# Patient Record
Sex: Male | Born: 1955 | Race: White | Hispanic: No | Smoking: Current every day smoker
Health system: Southern US, Community
[De-identification: ages and names within clinical notes are randomized; demographics above are authoritative.]

## PROBLEM LIST (undated history)

## (undated) DIAGNOSIS — F419 Anxiety disorder, unspecified: Secondary | ICD-10-CM

## (undated) DIAGNOSIS — Z8739 Personal history of other diseases of the musculoskeletal system and connective tissue: Secondary | ICD-10-CM

## (undated) DIAGNOSIS — M1990 Unspecified osteoarthritis, unspecified site: Secondary | ICD-10-CM

## (undated) DIAGNOSIS — E7800 Pure hypercholesterolemia, unspecified: Secondary | ICD-10-CM

## (undated) DIAGNOSIS — R519 Headache, unspecified: Secondary | ICD-10-CM

## (undated) DIAGNOSIS — I872 Venous insufficiency (chronic) (peripheral): Secondary | ICD-10-CM

## (undated) DIAGNOSIS — I10 Essential (primary) hypertension: Secondary | ICD-10-CM

## (undated) HISTORY — PX: LAPAROSCOPIC GASTRIC BANDING: SHX1100

## (undated) HISTORY — PX: BACK SURGERY: SHX140

---

## 1997-11-27 ENCOUNTER — Encounter: Payer: Self-pay | Admitting: Neurosurgery

## 1997-11-27 ENCOUNTER — Ambulatory Visit (HOSPITAL_COMMUNITY): Admission: RE | Admit: 1997-11-27 | Discharge: 1997-11-27 | Payer: Self-pay | Admitting: Neurosurgery

## 2000-05-16 ENCOUNTER — Other Ambulatory Visit: Admission: RE | Admit: 2000-05-16 | Discharge: 2000-05-16 | Payer: Self-pay | Admitting: Dermatology

## 2001-01-12 ENCOUNTER — Encounter: Admission: RE | Admit: 2001-01-12 | Discharge: 2001-04-12 | Payer: Self-pay | Admitting: Internal Medicine

## 2001-04-24 ENCOUNTER — Encounter: Payer: Self-pay | Admitting: Internal Medicine

## 2001-04-24 ENCOUNTER — Ambulatory Visit (HOSPITAL_COMMUNITY): Admission: RE | Admit: 2001-04-24 | Discharge: 2001-04-24 | Payer: Self-pay | Admitting: Internal Medicine

## 2001-05-27 ENCOUNTER — Emergency Department (HOSPITAL_COMMUNITY): Admission: EM | Admit: 2001-05-27 | Discharge: 2001-05-27 | Payer: Self-pay | Admitting: Emergency Medicine

## 2002-03-01 ENCOUNTER — Encounter: Payer: Self-pay | Admitting: Internal Medicine

## 2002-03-01 ENCOUNTER — Ambulatory Visit (HOSPITAL_COMMUNITY): Admission: RE | Admit: 2002-03-01 | Discharge: 2002-03-01 | Payer: Self-pay | Admitting: Internal Medicine

## 2002-06-06 ENCOUNTER — Encounter (HOSPITAL_COMMUNITY): Admission: RE | Admit: 2002-06-06 | Discharge: 2002-07-06 | Payer: Self-pay | Admitting: Preventative Medicine

## 2002-09-27 ENCOUNTER — Encounter: Payer: Self-pay | Admitting: Orthopedic Surgery

## 2002-09-27 ENCOUNTER — Ambulatory Visit (HOSPITAL_COMMUNITY): Admission: RE | Admit: 2002-09-27 | Discharge: 2002-09-27 | Payer: Self-pay | Admitting: Orthopedic Surgery

## 2003-02-13 ENCOUNTER — Encounter (HOSPITAL_COMMUNITY): Admission: RE | Admit: 2003-02-13 | Discharge: 2003-03-15 | Payer: Self-pay | Admitting: Orthopedic Surgery

## 2004-08-31 ENCOUNTER — Ambulatory Visit (HOSPITAL_COMMUNITY): Admission: RE | Admit: 2004-08-31 | Discharge: 2004-08-31 | Payer: Self-pay | Admitting: Internal Medicine

## 2004-11-09 ENCOUNTER — Ambulatory Visit (HOSPITAL_COMMUNITY): Admission: RE | Admit: 2004-11-09 | Discharge: 2004-11-09 | Payer: Self-pay | Admitting: Orthopaedic Surgery

## 2004-11-25 ENCOUNTER — Ambulatory Visit: Payer: Self-pay | Admitting: Psychology

## 2005-01-18 ENCOUNTER — Ambulatory Visit: Payer: Self-pay | Admitting: Psychology

## 2005-02-18 ENCOUNTER — Encounter: Admission: RE | Admit: 2005-02-18 | Discharge: 2005-02-18 | Payer: Self-pay | Admitting: Neurosurgery

## 2005-03-02 ENCOUNTER — Ambulatory Visit: Payer: Self-pay | Admitting: Psychiatry

## 2005-03-04 ENCOUNTER — Encounter: Admission: RE | Admit: 2005-03-04 | Discharge: 2005-03-04 | Payer: Self-pay | Admitting: Neurosurgery

## 2005-03-09 ENCOUNTER — Ambulatory Visit (HOSPITAL_COMMUNITY): Payer: Self-pay | Admitting: Psychology

## 2005-03-19 ENCOUNTER — Encounter: Admission: RE | Admit: 2005-03-19 | Discharge: 2005-03-19 | Payer: Self-pay | Admitting: Neurosurgery

## 2005-03-23 ENCOUNTER — Ambulatory Visit (HOSPITAL_COMMUNITY): Payer: Self-pay | Admitting: Psychology

## 2005-04-06 ENCOUNTER — Ambulatory Visit (HOSPITAL_COMMUNITY): Payer: Self-pay | Admitting: Psychology

## 2005-04-27 ENCOUNTER — Ambulatory Visit (HOSPITAL_COMMUNITY): Payer: Self-pay | Admitting: Psychiatry

## 2005-04-28 ENCOUNTER — Ambulatory Visit (HOSPITAL_COMMUNITY): Payer: Self-pay | Admitting: Psychology

## 2005-05-12 ENCOUNTER — Ambulatory Visit (HOSPITAL_COMMUNITY): Payer: Self-pay | Admitting: Psychology

## 2005-06-09 ENCOUNTER — Ambulatory Visit (HOSPITAL_COMMUNITY): Payer: Self-pay | Admitting: Psychology

## 2005-06-24 ENCOUNTER — Ambulatory Visit (HOSPITAL_COMMUNITY): Payer: Self-pay | Admitting: Psychiatry

## 2005-07-15 ENCOUNTER — Ambulatory Visit (HOSPITAL_COMMUNITY): Payer: Self-pay | Admitting: Psychology

## 2005-08-24 ENCOUNTER — Ambulatory Visit (HOSPITAL_COMMUNITY): Payer: Self-pay | Admitting: Psychiatry

## 2005-09-02 ENCOUNTER — Ambulatory Visit (HOSPITAL_COMMUNITY): Payer: Self-pay | Admitting: Psychology

## 2005-09-23 ENCOUNTER — Ambulatory Visit (HOSPITAL_COMMUNITY): Payer: Self-pay | Admitting: Psychology

## 2005-10-08 ENCOUNTER — Ambulatory Visit (HOSPITAL_COMMUNITY): Payer: Self-pay | Admitting: Psychology

## 2005-10-21 ENCOUNTER — Ambulatory Visit (HOSPITAL_COMMUNITY): Payer: Self-pay | Admitting: Psychiatry

## 2005-11-18 ENCOUNTER — Ambulatory Visit (HOSPITAL_COMMUNITY): Payer: Self-pay | Admitting: Psychology

## 2005-12-20 ENCOUNTER — Ambulatory Visit (HOSPITAL_COMMUNITY): Payer: Self-pay | Admitting: Psychology

## 2005-12-21 ENCOUNTER — Ambulatory Visit (HOSPITAL_COMMUNITY): Payer: Self-pay | Admitting: Psychiatry

## 2006-01-19 ENCOUNTER — Ambulatory Visit: Payer: Self-pay | Admitting: Internal Medicine

## 2006-01-19 ENCOUNTER — Ambulatory Visit (HOSPITAL_COMMUNITY): Admission: RE | Admit: 2006-01-19 | Discharge: 2006-01-19 | Payer: Self-pay

## 2006-01-21 ENCOUNTER — Ambulatory Visit (HOSPITAL_COMMUNITY): Payer: Self-pay | Admitting: Psychology

## 2006-02-11 ENCOUNTER — Ambulatory Visit (HOSPITAL_COMMUNITY): Payer: Self-pay | Admitting: Psychology

## 2006-02-22 ENCOUNTER — Ambulatory Visit (HOSPITAL_COMMUNITY): Payer: Self-pay | Admitting: Psychiatry

## 2006-03-08 ENCOUNTER — Ambulatory Visit (HOSPITAL_COMMUNITY): Payer: Self-pay | Admitting: Psychology

## 2006-03-15 ENCOUNTER — Ambulatory Visit (HOSPITAL_COMMUNITY): Admission: RE | Admit: 2006-03-15 | Discharge: 2006-03-15 | Payer: Self-pay | Admitting: Internal Medicine

## 2006-04-11 ENCOUNTER — Ambulatory Visit (HOSPITAL_COMMUNITY): Payer: Self-pay | Admitting: Psychology

## 2006-04-15 ENCOUNTER — Encounter: Admission: RE | Admit: 2006-04-15 | Discharge: 2006-04-15 | Payer: Self-pay | Admitting: Neurosurgery

## 2006-04-19 ENCOUNTER — Ambulatory Visit (HOSPITAL_COMMUNITY): Payer: Self-pay | Admitting: Psychiatry

## 2006-05-02 ENCOUNTER — Emergency Department (HOSPITAL_COMMUNITY): Admission: EM | Admit: 2006-05-02 | Discharge: 2006-05-02 | Payer: Self-pay | Admitting: Emergency Medicine

## 2006-05-02 ENCOUNTER — Encounter: Admission: RE | Admit: 2006-05-02 | Discharge: 2006-05-02 | Payer: Self-pay | Admitting: Neurosurgery

## 2006-05-11 ENCOUNTER — Ambulatory Visit (HOSPITAL_COMMUNITY): Payer: Self-pay | Admitting: Psychology

## 2006-05-19 ENCOUNTER — Encounter: Admission: RE | Admit: 2006-05-19 | Discharge: 2006-05-19 | Payer: Self-pay | Admitting: Neurosurgery

## 2006-06-09 ENCOUNTER — Ambulatory Visit (HOSPITAL_COMMUNITY): Payer: Self-pay | Admitting: Psychology

## 2006-07-11 ENCOUNTER — Ambulatory Visit (HOSPITAL_COMMUNITY): Payer: Self-pay | Admitting: Psychology

## 2006-07-14 ENCOUNTER — Ambulatory Visit (HOSPITAL_COMMUNITY): Payer: Self-pay | Admitting: Psychiatry

## 2006-07-20 ENCOUNTER — Ambulatory Visit (HOSPITAL_COMMUNITY): Admission: RE | Admit: 2006-07-20 | Discharge: 2006-07-20 | Payer: Self-pay | Admitting: Orthopaedic Surgery

## 2006-08-16 ENCOUNTER — Ambulatory Visit (HOSPITAL_COMMUNITY): Payer: Self-pay | Admitting: Psychology

## 2006-09-27 ENCOUNTER — Ambulatory Visit (HOSPITAL_COMMUNITY): Payer: Self-pay | Admitting: Psychology

## 2006-10-13 ENCOUNTER — Ambulatory Visit (HOSPITAL_COMMUNITY): Payer: Self-pay | Admitting: Psychiatry

## 2006-10-27 ENCOUNTER — Ambulatory Visit (HOSPITAL_COMMUNITY): Payer: Self-pay | Admitting: Psychology

## 2006-12-08 ENCOUNTER — Ambulatory Visit (HOSPITAL_COMMUNITY): Payer: Self-pay | Admitting: Psychology

## 2006-12-20 ENCOUNTER — Ambulatory Visit (HOSPITAL_COMMUNITY): Admission: RE | Admit: 2006-12-20 | Discharge: 2006-12-20 | Payer: Self-pay | Admitting: Internal Medicine

## 2007-01-06 ENCOUNTER — Ambulatory Visit (HOSPITAL_COMMUNITY): Payer: Self-pay | Admitting: Psychology

## 2007-01-12 ENCOUNTER — Ambulatory Visit (HOSPITAL_COMMUNITY): Payer: Self-pay | Admitting: Psychiatry

## 2007-01-31 ENCOUNTER — Ambulatory Visit (HOSPITAL_COMMUNITY): Admission: RE | Admit: 2007-01-31 | Discharge: 2007-02-01 | Payer: Self-pay | Admitting: Neurosurgery

## 2007-02-15 ENCOUNTER — Ambulatory Visit (HOSPITAL_COMMUNITY): Payer: Self-pay | Admitting: Psychology

## 2007-03-15 ENCOUNTER — Ambulatory Visit (HOSPITAL_COMMUNITY): Payer: Self-pay | Admitting: Psychology

## 2007-04-11 ENCOUNTER — Ambulatory Visit (HOSPITAL_COMMUNITY): Payer: Self-pay | Admitting: Psychiatry

## 2007-04-13 ENCOUNTER — Ambulatory Visit (HOSPITAL_COMMUNITY): Payer: Self-pay | Admitting: Psychology

## 2007-05-12 ENCOUNTER — Ambulatory Visit (HOSPITAL_COMMUNITY): Payer: Self-pay | Admitting: Psychology

## 2007-07-11 ENCOUNTER — Ambulatory Visit (HOSPITAL_COMMUNITY): Payer: Self-pay | Admitting: Psychiatry

## 2007-07-25 ENCOUNTER — Ambulatory Visit (HOSPITAL_COMMUNITY): Payer: Self-pay | Admitting: Psychology

## 2007-10-03 ENCOUNTER — Ambulatory Visit (HOSPITAL_COMMUNITY): Payer: Self-pay | Admitting: Psychiatry

## 2007-12-12 ENCOUNTER — Ambulatory Visit (HOSPITAL_COMMUNITY): Payer: Self-pay | Admitting: Psychiatry

## 2008-01-12 ENCOUNTER — Ambulatory Visit (HOSPITAL_COMMUNITY): Payer: Self-pay | Admitting: Psychology

## 2008-02-12 ENCOUNTER — Ambulatory Visit (HOSPITAL_COMMUNITY): Payer: Self-pay | Admitting: Psychology

## 2008-03-14 ENCOUNTER — Ambulatory Visit (HOSPITAL_COMMUNITY): Payer: Self-pay | Admitting: Psychiatry

## 2008-04-12 ENCOUNTER — Ambulatory Visit (HOSPITAL_COMMUNITY): Payer: Self-pay | Admitting: Psychology

## 2008-05-09 ENCOUNTER — Ambulatory Visit (HOSPITAL_COMMUNITY): Payer: Self-pay | Admitting: Psychiatry

## 2008-08-08 ENCOUNTER — Ambulatory Visit (HOSPITAL_COMMUNITY): Payer: Self-pay | Admitting: Psychiatry

## 2008-12-17 ENCOUNTER — Ambulatory Visit (HOSPITAL_COMMUNITY): Payer: Self-pay | Admitting: Psychiatry

## 2009-01-25 IMAGING — CR DG FOOT COMPLETE 3+V*L*
3 series · 3 of 3 positions shown · non-contrast
Comparison: None.

CLINICAL DATA: 50 year-old-male with pain in both feet.  Pain from superior portion of foot down to the lateral foot.  The pain started [REDACTED].  
 LEFT FOOT ? 3 VIEW:

[view not recorded (1 of 3)]
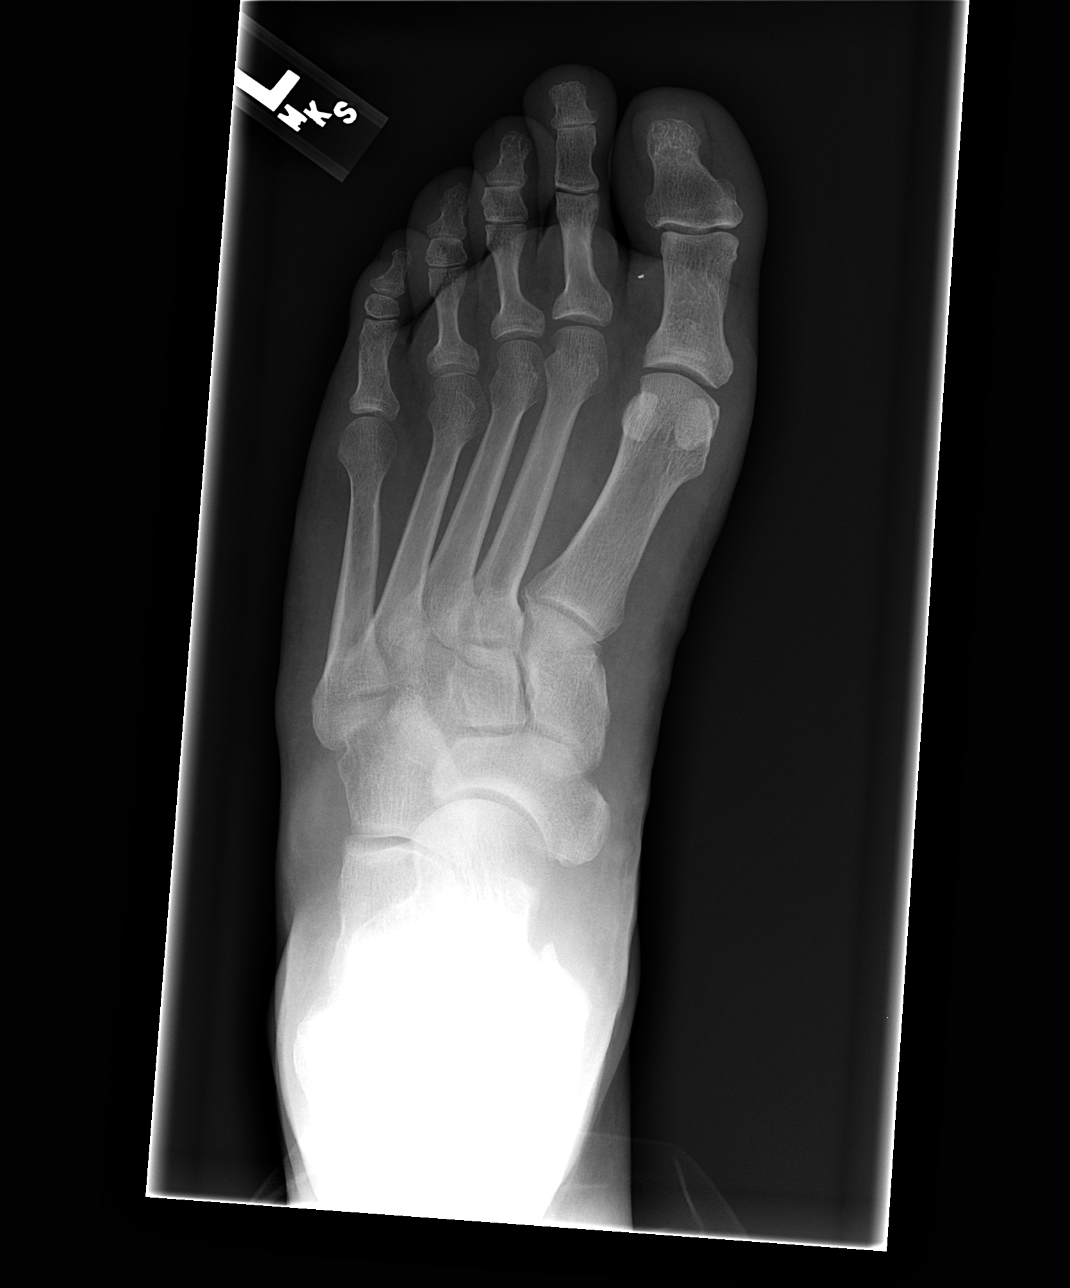

[view not recorded (2 of 3)]
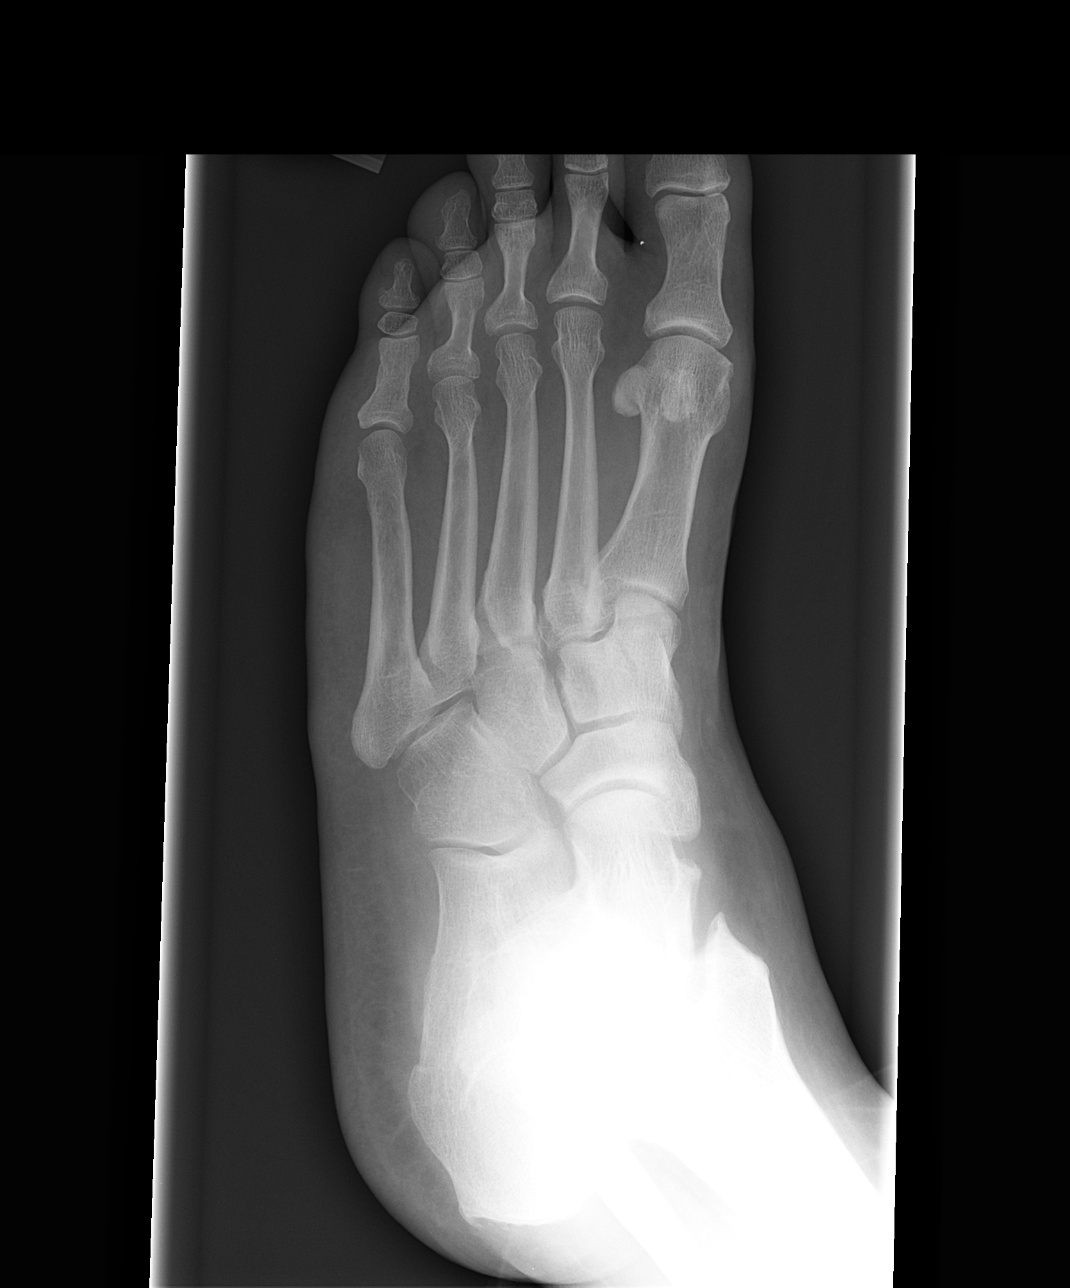

[view not recorded (3 of 3)]
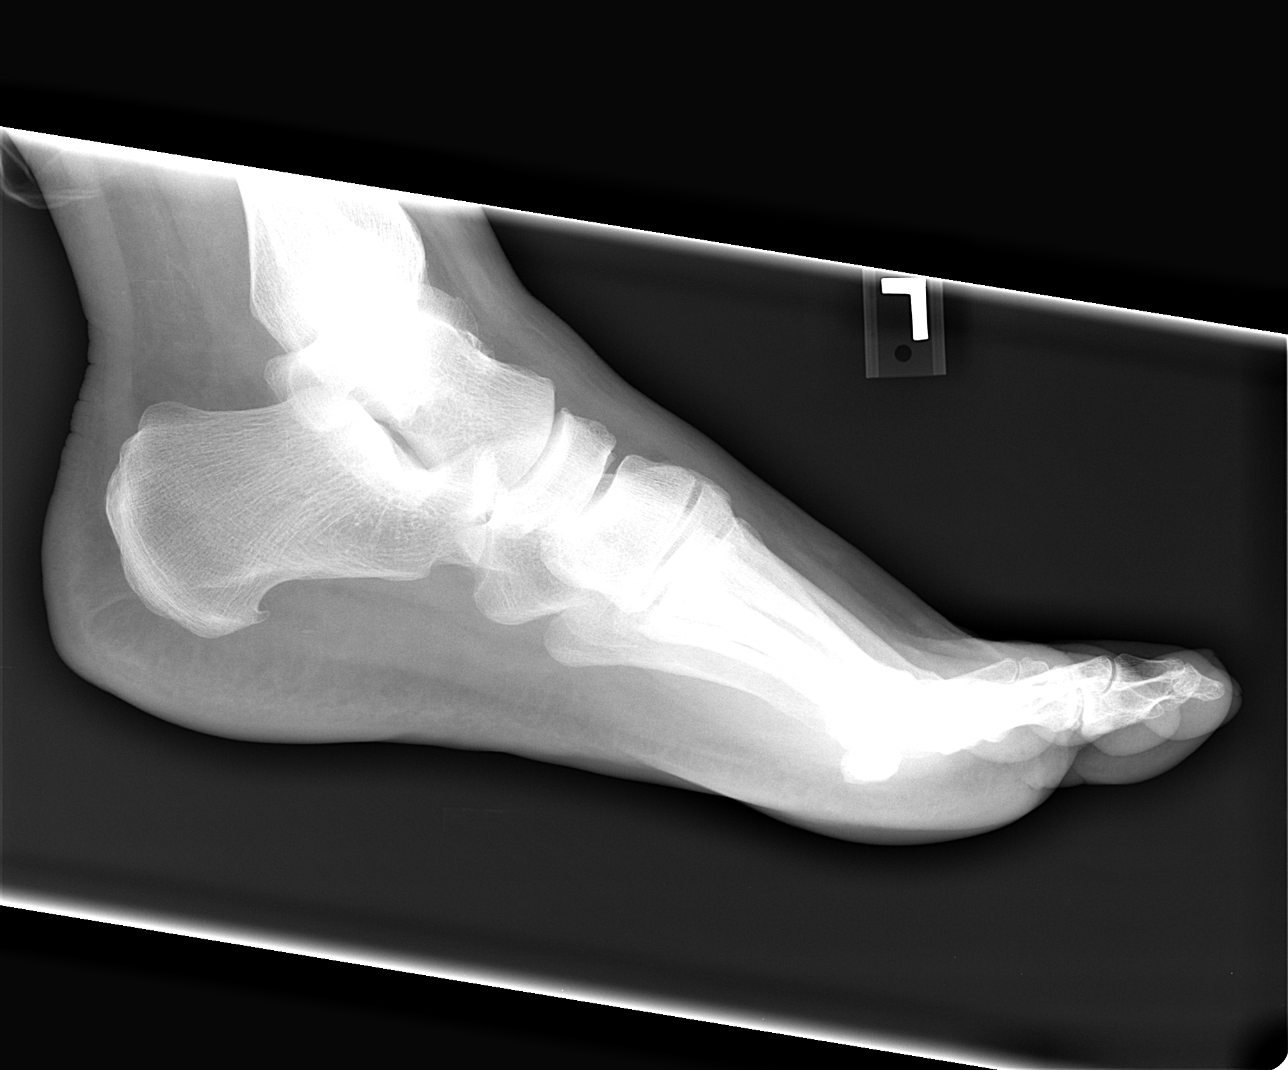

[3 of 3 positions shown; findings below may reference images not displayed]

FINDINGS: Small plantar calcaneal spur is identified.  No evidence for acute fracture or dislocation.  A small metallic foreign body is suspected just lateral to the proximal phalanx of this great toe.  This measures 3 mm in diameter.
IMPRESSION: 1.  No evidence for acute abnormality. 
 2.  Suspect small metallic foreign body within the lateral base of the great toe. 
 RIGHT FOOT ? 3 VIEW:
FINDINGS: There may be mild soft tissue swelling along the lateral aspect of the right foot.  No evidence for acute fracture, dislocation or foreign body, however.
IMPRESSION: 1.  Suspect lateral soft tissue swelling.  
 2.  No evidence for acute fracture.

## 2009-01-25 IMAGING — CR DG FOOT COMPLETE 3+V*R*
3 series · 3 of 3 positions shown · non-contrast
Comparison: None.

CLINICAL DATA: 50 year-old-male with pain in both feet.  Pain from superior portion of foot down to the lateral foot.  The pain started [REDACTED].  
 LEFT FOOT ? 3 VIEW:

[view not recorded (1 of 3)]
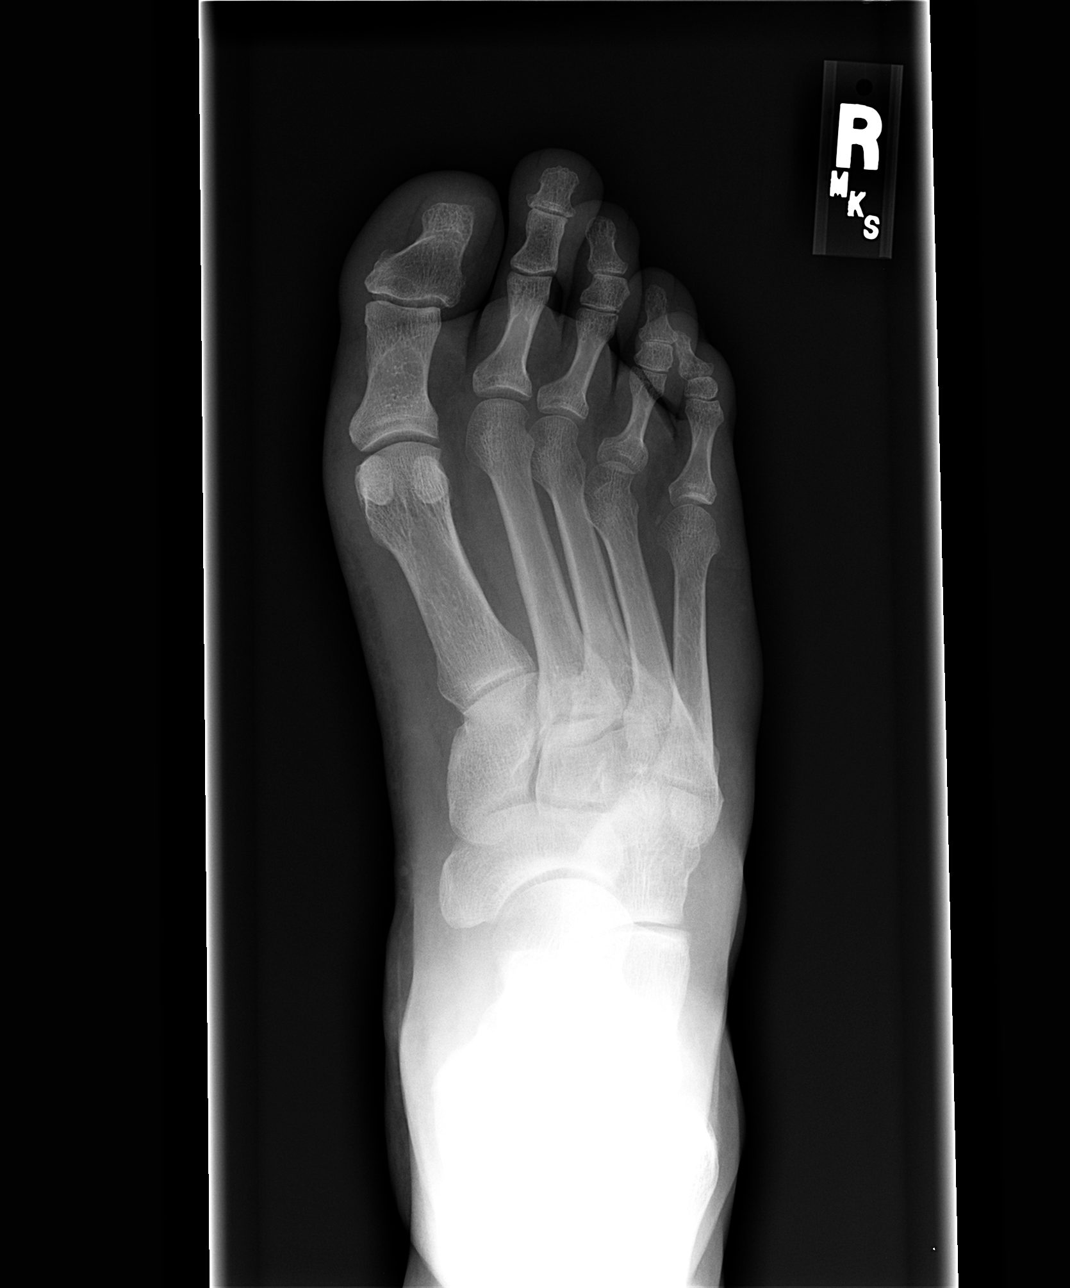

[view not recorded (2 of 3)]
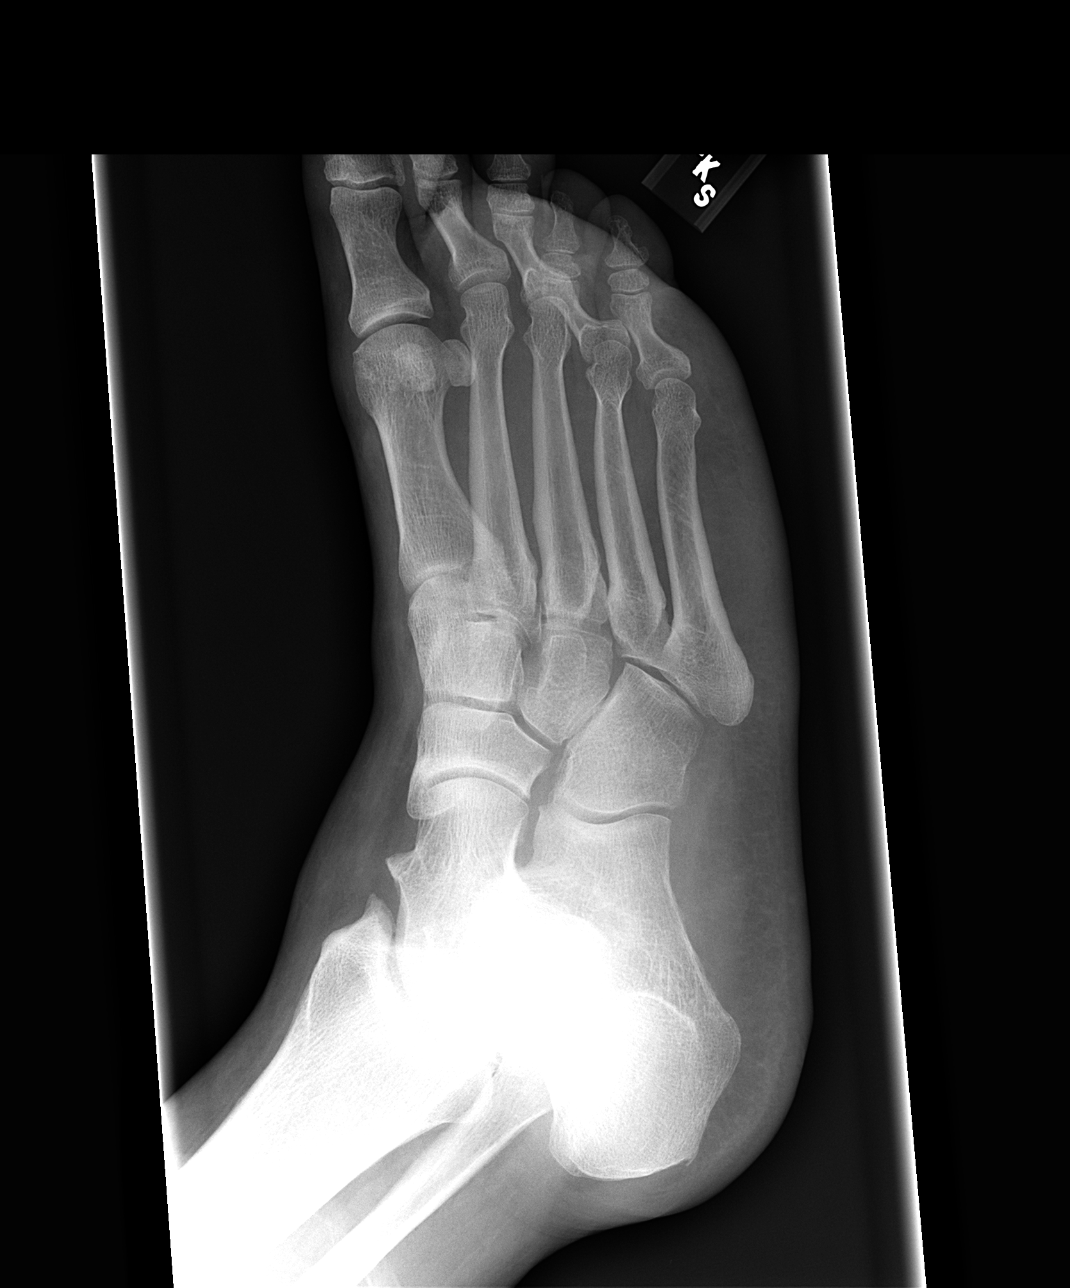

[view not recorded (3 of 3)]
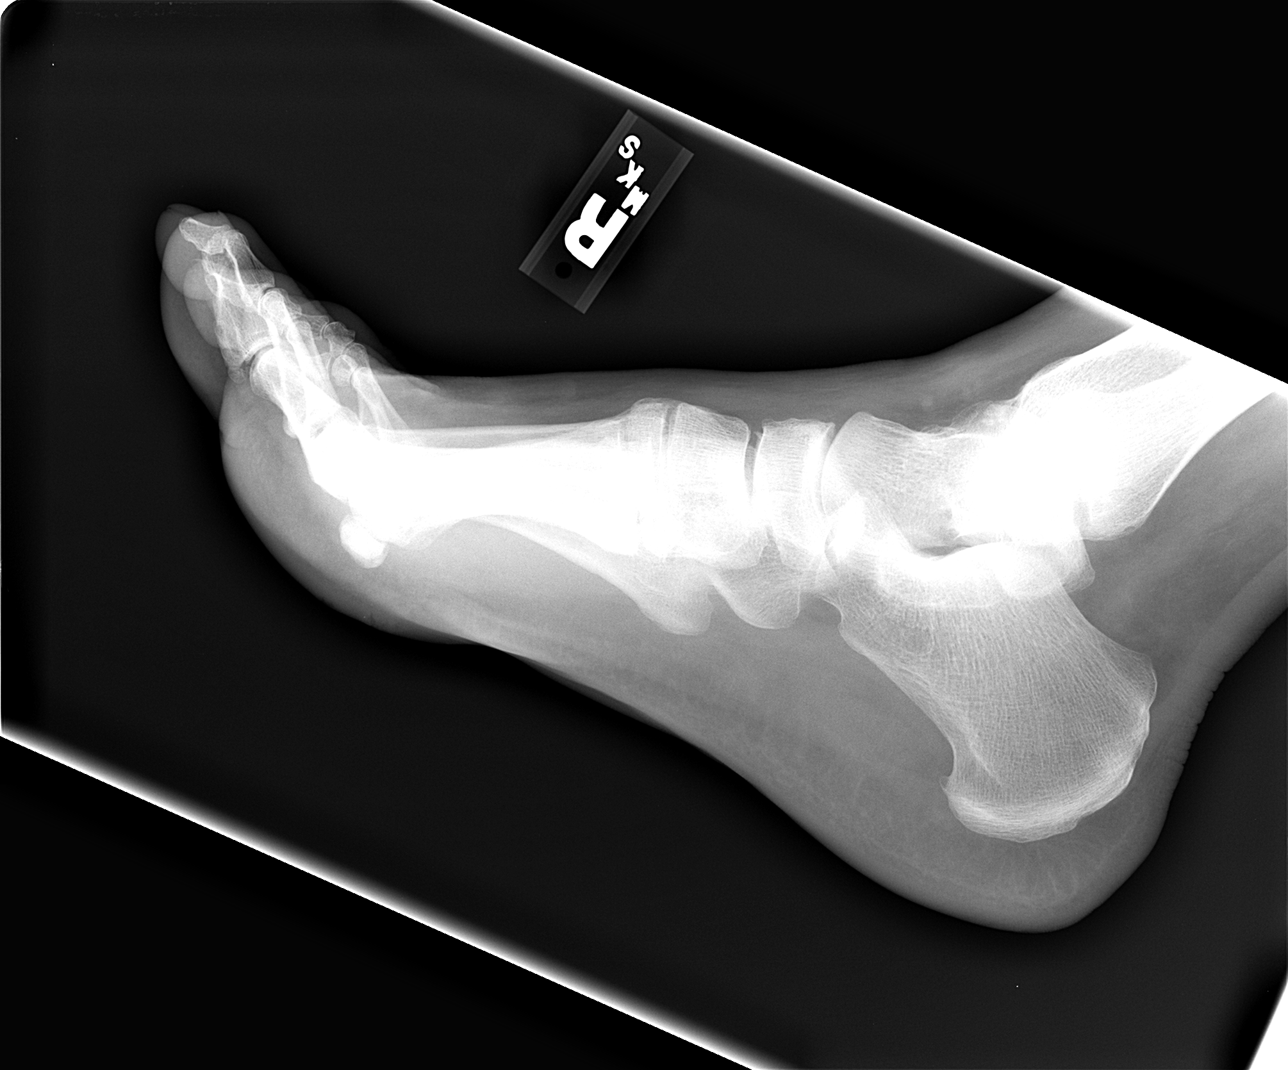

[3 of 3 positions shown; findings below may reference images not displayed]

FINDINGS: Small plantar calcaneal spur is identified.  No evidence for acute fracture or dislocation.  A small metallic foreign body is suspected just lateral to the proximal phalanx of this great toe.  This measures 3 mm in diameter.
IMPRESSION: 1.  No evidence for acute abnormality. 
 2.  Suspect small metallic foreign body within the lateral base of the great toe. 
 RIGHT FOOT ? 3 VIEW:
FINDINGS: There may be mild soft tissue swelling along the lateral aspect of the right foot.  No evidence for acute fracture, dislocation or foreign body, however.
IMPRESSION: 1.  Suspect lateral soft tissue swelling.  
 2.  No evidence for acute fracture.

## 2010-05-19 NOTE — Group Therapy Note (Signed)
NAMESTEPHENS, SHREVE NO.:  [id number]   MEDICAL RECORD NO.:  [id number]           PATIENT TYPE:   LOCATION:                                 FACILITY:   PHYSICIAN:  Syed T. Arfeen, M.D.        DATE OF BIRTH:                                 PROGRESS NOTE   Patient came in today for his followup appointment.  He appears to be  very happy and pleasant.  He had a lap band surgery on April 13, 2008,  which went very well.  He has now lost almost 50 pounds and feels better  with weight loss.  He feels more energetic and started to spend some  time in yard.  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  Patient had a lap band surgery at [facility] in [place].  Patient is very happy with his procedure.  He has  been compliant with his Paxil and he still requires sometimes  hydroxyzine for sleep.  He reported no side effects of the medication.  He endorsed increased energy level, and denies any negative thoughts.   ASSESSMENT:  Major depressive disorder.  Patient is pleasant, cooperative.  He denies  any auditory hallucinations, suicidal thoughts, or homicidal thoughts.  His affect is brighter.  He feels better since he lost 50 pounds due to  bariatric surgery.   PLAN:  We will continue his medication which is Paxil CR 25 mg one daily and  hydroxyzine 25 mg one to two at bedtime as needed.  We talked about  risks and benefits of the medication.  I will see him in 3 months.      Syed T. Lolly Mustache, M.D.  Electronically Signed     STA/MEDQ  D:  08/08/2008  T:  08/08/2008  Job:  [id number]

## 2010-05-19 NOTE — H&P (Signed)
NAMEMD, SMOLA NO.:  [id number]   MEDICAL RECORD NO.:  [id number]          PATIENT TYPE:  OIB   LOCATION:  5031                         FACILITY:  [facility]   PHYSICIAN:  Payton Doughty, M.D.      DATE OF BIRTH:  11-Aug-1955   DATE OF ADMISSION:  01/31/2007  DATE OF DISCHARGE:                              HISTORY & PHYSICAL   ADMISSION DIAGNOSIS:  Herniated disk at L5-S1 on the right.   This is a very nice 55 year old right-handed white gentleman who I have  been following for 10 years.  He injured his back in 90 and has been  struggling with it ever since.  He ended up with an MR that shows a disk  at 5-1 off to the right.  He is now admitted for lumbar diskectomy.   MEDICAL HISTORY:  Is remarkable for the venous stasis disease of the  lower extremities.  He uses compression stockings for those.  He also  has hypertension.   ALLERGIES:  None.   SOCIAL HISTORY:  He smokes a pack cigarettes a day, drinks alcohol on  social basis.  He is on disability.   FAMILY HISTORY:  Mother 41 in good health.  Father died of a malignant  chordoma at about 55 years of age.   REVIEW OF SYSTEMS:  Marked for back and leg pain.  HEENT:  Exam within  normal limits.  Has good range of motion in his neck.  CHEST: Has  diffuse crackles.  CARDIAC:  Regular rate and rhythm.  ABDOMEN:  Large  but nontender with no hepatosplenomegaly.  EXTREMITIES:  No clubbing,  cyanosis.  GU: Exam is deferred.  Peripheral pulses are good.  NEUROLOGICALLY:  He is awake, alert and oriented, cranial nerves intact.  Motor exam shows 5/5 strength throughout the upper and lower extremities  except for mild plantar flexion weakness on the right secondary to pain.  Sensation diminished in right S1 distribution.  Reflexes are 1 at the  knees, absent right ankle, 1 at the left.  Straight leg is positive on  the right.   MR shows disk at 5-1 worse off to the right.   CLINICAL IMPRESSION:  Right S1  radiculopathy related herniated disk.   PLAN:  Right L5-S1 laminectomy diskectomy.  The risks and benefits have  been discussed and he wishes to proceed.    .           ______________________________  Payton Doughty, M.D.    MWR/MEDQ  D:  01/31/2007  T:  02/01/2007  Job:  [id number]

## 2010-05-19 NOTE — Op Note (Signed)
NAMEDEZMEN, ALCOCK NO.:  [id number]   MEDICAL RECORD NO.:  [id number]          PATIENT TYPE:  OIB   LOCATION:  5031                         FACILITY:  [facility]   PHYSICIAN:  Payton Doughty, M.D.      DATE OF BIRTH:  1955/06/29   DATE OF PROCEDURE:  01/31/2007  DATE OF DISCHARGE:  02/01/2007                               OPERATIVE REPORT   PREOPERATIVE DIAGNOSIS:  Herniated disk on the right side at L5-S1.   POSTOPERATIVE DIAGNOSIS:  Herniated disk on the right side at L5-S1.   PROCEDURES:  Right L5-S1 laminectomy diskectomy.   ANESTHESIA:  General endotracheal.   PREP:  Sterile Betadine prep and scrub with alcohol wipe.   COMPLICATIONS:  None.   NURSE ASSISTANT:  [place].   DOCTOR ASSISTANT:  Hewitt Shorts, M.D.   BODY OF TEXT:  55 year old gentleman herniated disk at 5-1 on the right  and a right S1 radiculopathy, taken to operating room, smoothly  anesthetized, intubated, placed prone on the operating table.  Following  shave, prep, draped in usual sterile fashion, skin was infiltrated with  1% lidocaine with 1:400,000 epinephrine.  Skin was incised over the L5-  S1 interspace.  Dissecting to the subcutaneous fat which was fairly  abundant and muscle, the lamina of L5 was identified and confirmed with  intraoperative x-ray.  Hemisemilaminectomy of L5 was carried out with  high-speed drill to the top of ligamentum flavum.  It was removed in  retrograde fashion with the Kerrison punch.  The thecal sac and the  right S1 nerve root were identified and nerve root was mobilized. Right  S1 nerve root was mobilized medially retracted with D'Errico retractor.  Annular fibers were divided and the disk removed.  Disk removed without  difficulty.  Nerve root carefully explored and all quadrants found to be  open.  Wound was irrigated.  Hemostasis assured and Depo-Medrol soaked  fat was packed in laminotomy defect.  Successive layers of 0-0 Vicryl, 2-  0  Vicryl, 3-0 nylon were used to close.  Betadine Telfa dressing was  applied, made occlusive with OpSite and the patient returned recovery  room in good condition.           ______________________________  Payton Doughty, M.D.     MWR/MEDQ  D:  01/31/2007  T:  02/01/2007  Job:  [id number]

## 2010-05-22 NOTE — Op Note (Signed)
[name], [name]                ACCOUNT NO.:  [id number]   MEDICAL RECORD NO.:  [id number]          PATIENT TYPE:  AMB   LOCATION:  DAY                           FACILITY:  [facility]   PHYSICIAN:  R. Roetta Sessions, M.D. DATE OF BIRTH:  11/21/1955   DATE OF PROCEDURE:  01/19/2006  DATE OF DISCHARGE:                               OPERATIVE REPORT   PROCEDURE:  Screening colonoscopy.   INDICATIONS FOR PROCEDURE:  The patient is a 55 year old gentleman  devoid of any GI tract symptoms sent over courtesy of Dr. Carylon Perches for  colorectal cancer screening.  He has never had his lower GI tract  imaged.  There is no family history of colorectal neoplasia.  He is here  for screening.  This approach has discussed with the patient at length.  Potential risks, benefits and alternatives have been reviewed, questions  answered, and he is agreeable.  Please see documentation in the medical  record.   PROCEDURE NOTE:  O2 saturation, blood pressure, pulse and respirations  were monitored throughout the entire procedure.   CONSCIOUS SEDATION:  Versed 5 mg IV, Demerol 100 mg IV in divided doses.   INSTRUMENT:  Pentax video chip system.   FINDINGS:  Digital rectal exam revealed no abnormalities.   ENDOSCOPIC FINDINGS:  The prep was excellent.  Examination of colonic  mucosa from rectosigmoid junction through the left, transverse and right  colon, to area of appendiceal orifice, ileocecal valve and cecum were  undertaken.  These structures well seen and photographed for the record.  From this level scope was withdrawn.  All previous mucosal surface were  again seen.  The colonic mucosa appeared normal.  The scope was pulled  down to the rectum.  Thorough examination of rectal mucosa including  retroflex view of the anal verge was undertaken.  The rectal mucosa  appeared normal.  The patient tolerated the procedure well was reacted  in endoscopy.   IMPRESSION:  1. Normal rectum.  2. Normal  colon.   RECOMMENDATIONS:  Repeat screening colonoscopy 10 years.      Erik Wright, M.D.  Electronically Signed     RMR/MEDQ  D:  01/19/2006  T:  01/19/2006  Job:  [id number]   cc:   Kingsley Callander. Ouida Sills, MD  Fax: [phone]

## 2010-09-25 LAB — DIFFERENTIAL
Basophils Absolute: 0
Basophils Relative: 0
Eosinophils Relative: 3
Neutro Abs: 4.9

## 2010-09-25 LAB — COMPREHENSIVE METABOLIC PANEL
ALT: 32
AST: 26
Albumin: 4.1
BUN: 14
Calcium: 9.5
Chloride: 102
GFR calc non Af Amer: >60
Glucose, Bld: 88
Potassium: 4.5
Total Protein: 7.1

## 2010-09-25 LAB — URINALYSIS, ROUTINE W REFLEX MICROSCOPIC
Glucose, UA: NEGATIVE
Specific Gravity, Urine: 1.024
Urobilinogen, UA: 1
pH: 7

## 2010-09-25 LAB — CBC
MCV: 91.1
RDW: 14.1
WBC: 7.4

## 2010-09-25 LAB — APTT: aPTT: 27

## 2011-07-16 ENCOUNTER — Other Ambulatory Visit (HOSPITAL_COMMUNITY): Payer: Self-pay | Admitting: Psychiatry

## 2011-08-12 ENCOUNTER — Other Ambulatory Visit (HOSPITAL_COMMUNITY): Payer: Self-pay | Admitting: Internal Medicine

## 2011-08-12 ENCOUNTER — Ambulatory Visit (HOSPITAL_COMMUNITY)
Admission: RE | Admit: 2011-08-12 | Discharge: 2011-08-12 | Disposition: A | Discharge status: Home / Self Care | Payer: Federal, State, Local not specified - PPO | Source: Ambulatory Visit | Attending: Internal Medicine | Admitting: Internal Medicine

## 2011-08-12 DIAGNOSIS — M25569 Pain in unspecified knee: Secondary | ICD-10-CM | POA: Insufficient documentation

## 2011-08-12 DIAGNOSIS — R52 Pain, unspecified: Secondary | ICD-10-CM

## 2012-06-23 ENCOUNTER — Ambulatory Visit (HOSPITAL_COMMUNITY)
Admission: RE | Admit: 2012-06-23 | Discharge: 2012-06-23 | Disposition: A | Discharge status: Home / Self Care | Payer: Federal, State, Local not specified - PPO | Source: Ambulatory Visit | Attending: Internal Medicine | Admitting: Internal Medicine

## 2012-06-23 ENCOUNTER — Emergency Department (HOSPITAL_COMMUNITY)
Admission: EM | Admit: 2012-06-23 | Discharge: 2012-06-23 | Disposition: A | Discharge status: Home / Self Care | Payer: Federal, State, Local not specified - PPO | Attending: Emergency Medicine | Admitting: Emergency Medicine

## 2012-06-23 ENCOUNTER — Other Ambulatory Visit (HOSPITAL_COMMUNITY): Payer: Self-pay | Admitting: Internal Medicine

## 2012-06-23 ENCOUNTER — Encounter (HOSPITAL_COMMUNITY): Payer: Self-pay | Admitting: *Deleted

## 2012-06-23 DIAGNOSIS — M79672 Pain in left foot: Secondary | ICD-10-CM

## 2012-06-23 DIAGNOSIS — F17200 Nicotine dependence, unspecified, uncomplicated: Secondary | ICD-10-CM | POA: Insufficient documentation

## 2012-06-23 DIAGNOSIS — W208XXA Other cause of strike by thrown, projected or falling object, initial encounter: Secondary | ICD-10-CM | POA: Insufficient documentation

## 2012-06-23 DIAGNOSIS — S92912B Unspecified fracture of left toe(s), initial encounter for open fracture: Secondary | ICD-10-CM

## 2012-06-23 DIAGNOSIS — S90212A Contusion of left great toe with damage to nail, initial encounter: Secondary | ICD-10-CM

## 2012-06-23 DIAGNOSIS — Y9389 Activity, other specified: Secondary | ICD-10-CM | POA: Insufficient documentation

## 2012-06-23 DIAGNOSIS — S90129A Contusion of unspecified lesser toe(s) without damage to nail, initial encounter: Secondary | ICD-10-CM | POA: Insufficient documentation

## 2012-06-23 DIAGNOSIS — S97102A Crushing injury of unspecified left toe(s), initial encounter: Secondary | ICD-10-CM

## 2012-06-23 DIAGNOSIS — Z79899 Other long term (current) drug therapy: Secondary | ICD-10-CM | POA: Insufficient documentation

## 2012-06-23 DIAGNOSIS — Y9289 Other specified places as the place of occurrence of the external cause: Secondary | ICD-10-CM | POA: Insufficient documentation

## 2012-06-23 DIAGNOSIS — S97109A Crushing injury of unspecified toe(s), initial encounter: Secondary | ICD-10-CM | POA: Insufficient documentation

## 2012-06-23 DIAGNOSIS — S92919A Unspecified fracture of unspecified toe(s), initial encounter for closed fracture: Secondary | ICD-10-CM | POA: Insufficient documentation

## 2012-06-23 DIAGNOSIS — R110 Nausea: Secondary | ICD-10-CM | POA: Insufficient documentation

## 2012-06-23 MED ORDER — CEFAZOLIN SODIUM 1 G IJ SOLR
1.0000 g | Freq: Once | INTRAMUSCULAR | Status: AC
  Administered 2012-06-23: 1 g via INTRAMUSCULAR
  Filled 2012-06-23: qty 10

## 2012-06-23 MED ORDER — BUPIVACAINE HCL (PF) 0.5 % IJ SOLN
INTRAMUSCULAR | Status: AC
  Filled 2012-06-23: qty 30

## 2012-06-23 MED ORDER — BUPIVACAINE HCL (PF) 0.5 % IJ SOLN
10.0000 mL | Freq: Once | INTRAMUSCULAR | Status: AC
  Administered 2012-06-23: 10 mL

## 2012-06-23 MED ORDER — OXYCODONE-ACETAMINOPHEN 5-325 MG PO TABS
1.0000 | ORAL_TABLET | Freq: Once | ORAL | Status: AC
  Administered 2012-06-23: 1 via ORAL
  Filled 2012-06-23: qty 1

## 2012-06-23 MED ORDER — ONDANSETRON 8 MG PO TBDP
8.0000 mg | ORAL_TABLET | Freq: Once | ORAL | Status: AC
  Administered 2012-06-23: 8 mg via ORAL
  Filled 2012-06-23: qty 1

## 2012-06-23 MED ORDER — BACITRACIN ZINC 500 UNIT/GM EX OINT
TOPICAL_OINTMENT | CUTANEOUS | Status: AC
  Administered 2012-06-23: 1
  Filled 2012-06-23: qty 0.9

## 2012-06-23 MED ORDER — CEPHALEXIN 500 MG PO CAPS: 500.0000 mg | ORAL_CAPSULE | Freq: Three times a day (TID) | ORAL | Status: DC

## 2012-06-23 MED ORDER — LIDOCAINE HCL (PF) 2 % IJ SOLN: 10.0000 mL | Freq: Once | INTRAMUSCULAR | Status: DC

## 2012-06-23 MED ORDER — OXYCODONE-ACETAMINOPHEN 5-325 MG PO TABS: ORAL_TABLET | ORAL | Status: DC

## 2012-06-23 NOTE — ED Notes (Signed)
Pt states left great toe injury. Bleeding to the toe. Xray shoes comminuted fracture deformity involving the distal phalanx. Pt states a tractor weight fell on the toe just PTA. [facility]agan's office sent pt over for an xray and for orthopedic to see him in ED. Unsure if orthopedist has been contacted.

## 2012-06-23 NOTE — ED Notes (Signed)
Pt states he has crutches at home.

## 2012-06-23 NOTE — ED Notes (Signed)
Left in c/o spouse for transport home; instructions, prescriptions and f/u information given/reviewed - verbalizes understanding.

## 2012-06-23 NOTE — ED Notes (Signed)
Contacted Dr. Romeo Apple. He stated Dr. Alonza Smoker office notified him and he told the office to have the patient seen in the ED first.

## 2012-06-23 NOTE — ED Provider Notes (Signed)
History    This chart was scribed for Ward Givens, MD by Leone Payor, ED Scribe. This patient was seen in room [facility] and the patient's care was started 2:03 PM.  CSN: [id number]  Arrival date & time 06/23/12  1336   First MD Initiated Contact with Patient 06/23/12 1358      Chief Complaint  Patient presents with  . Toe Injury     The history is provided by the patient. No language interpreter was used.    HPI Comments: Erik Wright is a 57 y.o. male who presents to the Emergency Department complaining of constant, unchanged L great toe injury that occurred about 2 hours ago. He reports a tractor weight (75-100 lb) fell off of something and onto his toe. He was with a few other people who were able to remove the weight soon after it fell. He now has pain, swelling, and bleeding from a laceration to the bottom of great toe. He denies any other injury. He c/o a lot of pain and is getting nauseated without vomiting. Per pt, he called Dr. Alonza Smoker office who sent him for outpatient xrays and then was instructed to come to the ED to see Dr Romeo Apple, orthopedics.  Last PO was this morning around 9am. He denies DM.   Last tetanus is unknown but doesn't believe it is greater than 10 years.   Pt is a 1.2 pack smoker.   PCP Dr. Ouida Sills.  History reviewed. No pertinent past medical history.  Past Surgical History  Procedure Laterality Date  . Back surgery      History reviewed. No pertinent family history.  History  Substance Use Topics  . Smoking status: Current Every Day Smoker  . Smokeless tobacco: Not on file  . Alcohol Use: Yes     Comment: Occ  lives at home    Review of Systems  Musculoskeletal: Positive for joint swelling and arthralgias (L great toe pain).  Skin: Positive for wound (L great toe bleeding).  All other systems reviewed and are negative.    Allergies  Review of patient's allergies indicates no known allergies.  Home Medications   Current  Outpatient Rx  Name  Route  Sig  Dispense  Refill  . chlorthalidone (HYGROTON) 25 MG tablet   Oral   Take 12.5 mg by mouth daily.         . diazepam (VALIUM) 5 MG tablet   Oral   Take 5 mg by mouth 2 (two) times daily as needed for anxiety.         Marland Kitchen PARoxetine (PAXIL-CR) 25 MG 24 hr tablet   Oral   Take 25 mg by mouth daily.         . simvastatin (ZOCOR) 20 MG tablet   Oral   Take 20 mg by mouth daily.         Percocet for chronic back pain prescribed by Dr Channing Mutters  BP 131/94  Pulse 88  Temp(Src) 97.8 F (36.6 C) (Oral)  Resp 20  Ht 5\' 10"  (1.778 m)  Wt 260 lb (117.935 kg)  BMI 37.31 kg/m2  SpO2 98%  Vital signs normal    Physical Exam  Nursing note and vitals reviewed. Constitutional: He is oriented to person, place, and time. He appears well-developed and well-nourished.  Non-toxic appearance. He does not appear ill. No distress.  HENT:  Head: Normocephalic and atraumatic.  Right Ear: External ear normal.  Left Ear: External ear normal.  Nose: Nose normal.  No mucosal edema or rhinorrhea.  Mouth/Throat: Mucous membranes are normal. No dental abscesses or edematous.  Eyes: Conjunctivae and EOM are normal. Pupils are equal, round, and reactive to light.  Neck: Normal range of motion and full passive range of motion without pain. Neck supple.  Pulmonary/Chest: Effort normal. No respiratory distress. He has no rhonchi. He exhibits no crepitus.  Abdominal: Normal appearance.  Musculoskeletal: Normal range of motion. He exhibits tenderness.  Moves all extremities well. Diffuse mild swelling of L great toe. Pt has dried blood covering his toe. He has mild swelling and bruising of the second toe.   Neurological: He is alert and oriented to person, place, and time. He has normal strength. No cranial nerve deficit.  Skin: Skin is warm, dry and intact. No rash noted. No erythema. No pallor.  Small subungual hematoma present on the left great toe. Has bruising and swelling  of volar aspect of great toe. There is a small tear in the skin just prox to the nail fold that is bleeding.   Psychiatric: He has a normal mood and affect. His speech is normal and behavior is normal. His mood appears not anxious.    ED Course  Procedures (including critical care time)  Medications  oxyCODONE-acetaminophen (PERCOCET/ROXICET) 5-325 MG per tablet 1 tablet (not administered)  ceFAZolin (ANCEF) injection 1 g (1 g Intramuscular Given 06/23/12 1442)  ondansetron (ZOFRAN-ODT) disintegrating tablet 8 mg (8 mg Oral Given 06/23/12 1442)  bupivacaine (MARCAINE) 0.5 % injection 10 mL (10 mLs Infiltration Given 06/23/12 1443)  bacitracin 500 UNIT/GM ointment (1 application  Given 06/23/12 1444)     DIAGNOSTIC STUDIES: Oxygen Saturation is 98% on RA, normal by my interpretation.    COORDINATION OF CARE: 2:20 PM Discussed treatment plan with pt at bedside and pt agreed to plan.   Digital Block performed on his left great toe with 0.5 % marcaine with almost immediate relief of pain.  His toe was cleaned by me because it was covered with blood and I couldn't tell if he had a laceration and the only bleeding site was on the lateral nail fold there is a small skin tear that is bleeding. 2 trephination holes were placed in the proximal left great toenail with electrocautery with release of blood. Toe wrapped by nursing staff, however he continued to have bleeding and it was rewrapped with surgicel than a heavier dressing Pt placed in post-op shoe and crutches.   2:27 PM Consult with Dr. Romeo Apple. He will see the patient in the office on Monday, June 23rd at 3 pm.    2:30 PM Pt updated about treatment plan in ED. Will need to follow up with Dr. Romeo Apple in office.    Dg Foot Complete Left  06/23/2012   *RADIOLOGY REPORT*  Clinical Data: Foot pain  LEFT FOOT - COMPLETE 3+ VIEW  Comparison: None.  Findings: There is a comminuted fracture deformity involving the distal phalanx.  The fracture  fragments are in near anatomic alignment.  No dislocation.  Again noted is a small metallic foreign body within the soft tissues lateral to the first proximal phalanx.  IMPRESSION:  1.  Comminuted fracture deformity involves the distal phalanx of the great toe.   Original Report Authenticated By: Signa Kell, M.D.   Dg Toe Great Left  06/23/2012   *RADIOLOGY REPORT*  Clinical Data: Dropped to 75 pounds weight on toe  RIGHT GREAT TOE  Comparison: 03/15/2006.  Findings: Comminuted fracture deformity involving the distal phalanx noted.  Fracture fragments are in near anatomic alignment. No dislocation.  Small foreign body is again identified within the soft tissues adjacent to the proximal phalanx.  IMPRESSION:  1.  Comminuted fracture deformity involves the distal phalanx.   Original Report Authenticated By: Signa Kell, M.D.     1. Crush injury, toe, left, initial encounter   2. Subungual hematoma of great toe of left foot, initial encounter   3. Fracture, toe, left, open, initial encounter     New Prescriptions   CEPHALEXIN (KEFLEX) 500 MG CAPSULE    Take 1 capsule (500 mg total) by mouth 3 (three) times daily.   OXYCODONE-ACETAMINOPHEN (PERCOCET/ROXICET) 5-325 MG PER TABLET    Take 1 or 2 po Q 6hrs for pain   Plan discharge  Devoria Albe, MD, FACEP   MDM    I personally performed the services described in this documentation, which was scribed in my presence. The recorded information has been reviewed and considered.   Ward Givens, MD 06/23/12 2204

## 2012-06-26 ENCOUNTER — Ambulatory Visit (INDEPENDENT_AMBULATORY_CARE_PROVIDER_SITE_OTHER): Payer: Federal, State, Local not specified - PPO | Admitting: Orthopedic Surgery

## 2012-06-26 ENCOUNTER — Encounter: Payer: Self-pay | Admitting: Orthopedic Surgery

## 2012-06-26 VITALS — BP 130/92 | Ht 70.0 in | Wt 265.0 lb

## 2012-06-26 DIAGNOSIS — S92919A Unspecified fracture of unspecified toe(s), initial encounter for closed fracture: Secondary | ICD-10-CM

## 2012-06-26 DIAGNOSIS — S92403A Displaced unspecified fracture of unspecified great toe, initial encounter for closed fracture: Secondary | ICD-10-CM | POA: Insufficient documentation

## 2012-06-26 DIAGNOSIS — K5909 Other constipation: Secondary | ICD-10-CM

## 2012-06-26 DIAGNOSIS — S92402A Displaced unspecified fracture of left great toe, initial encounter for closed fracture: Secondary | ICD-10-CM

## 2012-06-26 DIAGNOSIS — K5900 Constipation, unspecified: Secondary | ICD-10-CM

## 2012-06-26 MED ORDER — MAGNESIUM CITRATE PO SOLN: 296.0000 mL | Freq: Once | ORAL | Status: DC

## 2012-06-26 MED ORDER — OXYCODONE-ACETAMINOPHEN 5-325 MG PO TABS: ORAL_TABLET | ORAL | Status: DC

## 2012-06-26 MED ORDER — POLYETHYLENE GLYCOL 3350 17 GM/SCOOP PO POWD: 17.0000 g | Freq: Every day | ORAL | Status: DC

## 2012-06-26 NOTE — Patient Instructions (Addendum)
Wear shoe x 4 weeks   Take meds for pain elevate

## 2012-06-26 NOTE — Progress Notes (Signed)
Patient ID: Erik Wright, male   DOB: [birth year], 58 y.o.   MRN: [id number] Chief Complaint  Patient presents with  . Toe Pain    Great toe fracture left foot DOI 06/23/12    History. 06/24/2012 patient dropped 100 pound object on left great toe. He went to his primary care physician, was sent to the emergency room office was closed. Pain is throbbing 7/10 Constant Pain is better with elevation, medication Worse with dependency Other symptoms and signs include bruising and swelling denies numbness and tingling  System review negative except for musculoskeletal system swelling  Allergies none Previous surgery on his lower back History of venous insufficiency  Medications are proxitine 25, simvastatin 25, chlorthalidone 20 5/2 tablet daily  Family history negative  Social history married former  Half pack per day smoker Small amount of alcohol on the weekends no street drugs  BP 130/92  Ht 5\' 10"  (1.778 m)  Wt 265 lb (120.203 kg)  BMI 38.02 kg/m2 General appearance is normal, the patient is alert and oriented x3 with normal mood and affect. Body habitus large. Patient is in the toe with postop shoe and crutches  Has a very swollen toe ecchymosis bruising subungual hematoma which was decompressed in the ER painful range of motion tenderness around the distal phalanx no instability. Foot normal muscle tone. Skin ecchymotic but intact capillary refill normal dorsalis pedis pulse normal sensation normal no sign of compartment syndrome  X-ray comminuted distal phalanx fracture great toe  Plan ice elevation, medication. Support for the toe with a postop shoe  Followup 4 weeks for reevaluation no x-rays needed

## 2012-07-18 ENCOUNTER — Ambulatory Visit: Payer: Federal, State, Local not specified - PPO | Admitting: Orthopedic Surgery

## 2012-07-20 ENCOUNTER — Encounter: Payer: Self-pay | Admitting: Orthopedic Surgery

## 2012-07-20 ENCOUNTER — Ambulatory Visit (INDEPENDENT_AMBULATORY_CARE_PROVIDER_SITE_OTHER): Payer: Federal, State, Local not specified - PPO | Admitting: Orthopedic Surgery

## 2012-07-20 VITALS — BP 138/98 | Ht 70.0 in | Wt 265.0 lb

## 2012-07-20 DIAGNOSIS — S92402D Displaced unspecified fracture of left great toe, subsequent encounter for fracture with routine healing: Secondary | ICD-10-CM

## 2012-07-20 DIAGNOSIS — IMO0001 Reserved for inherently not codable concepts without codable children: Secondary | ICD-10-CM

## 2012-07-20 MED ORDER — OXYCODONE-ACETAMINOPHEN 5-325 MG PO TABS: ORAL_TABLET | ORAL | Status: DC

## 2012-07-20 NOTE — Progress Notes (Signed)
Patient ID: Erik Wright, male   DOB: 04-Nov-1955, 57 y.o.   MRN: [id number] Chief Complaint  Patient presents with  . Follow-up    3 week recheck left great toe fx DOI 06/23/12   Fracture care left great toe fracture still complains of pain and inability to get a shoe on  X-rays show comminuted fracture  Nail hematoma noted nail is going to definitely self remove. Alignment is normal. No signs of infection.  Toe fracture left great toe  Followup as needed take pain medication as needed which was refilled today  Wear postop shoe until you can get a shoe on no followup required

## 2012-07-20 NOTE — Patient Instructions (Signed)
Continue hard sole shoe until the swelling goes down and then apply regular shoe followup as needed take pain medication as needed

## 2012-12-13 ENCOUNTER — Telehealth: Payer: Self-pay | Admitting: Orthopedic Surgery

## 2012-12-13 NOTE — Telephone Encounter (Signed)
Patient called this morning asking for an appointment for today, said he fell yesterday and his wrist is hurting.  Advised Dr. Romeo Apple only here this morning and no available appointments.  Advised he go to the ER or urgent care

## 2012-12-20 ENCOUNTER — Ambulatory Visit (INDEPENDENT_AMBULATORY_CARE_PROVIDER_SITE_OTHER): Payer: Federal, State, Local not specified - PPO | Admitting: Orthopedic Surgery

## 2012-12-20 ENCOUNTER — Encounter: Payer: Self-pay | Admitting: Orthopedic Surgery

## 2012-12-20 ENCOUNTER — Ambulatory Visit (INDEPENDENT_AMBULATORY_CARE_PROVIDER_SITE_OTHER): Payer: Federal, State, Local not specified - PPO

## 2012-12-20 ENCOUNTER — Ambulatory Visit (HOSPITAL_COMMUNITY)
Admission: RE | Admit: 2012-12-20 | Discharge: 2012-12-20 | Disposition: A | Discharge status: Home / Self Care | Payer: Federal, State, Local not specified - PPO | Source: Ambulatory Visit | Attending: Orthopedic Surgery | Admitting: Orthopedic Surgery

## 2012-12-20 VITALS — BP 146/98 | Ht 70.0 in | Wt 265.0 lb

## 2012-12-20 DIAGNOSIS — M1711 Unilateral primary osteoarthritis, right knee: Secondary | ICD-10-CM

## 2012-12-20 DIAGNOSIS — M25569 Pain in unspecified knee: Secondary | ICD-10-CM

## 2012-12-20 DIAGNOSIS — M23321 Other meniscus derangements, posterior horn of medial meniscus, right knee: Secondary | ICD-10-CM

## 2012-12-20 DIAGNOSIS — M25579 Pain in unspecified ankle and joints of unspecified foot: Secondary | ICD-10-CM

## 2012-12-20 DIAGNOSIS — M25561 Pain in right knee: Secondary | ICD-10-CM

## 2012-12-20 DIAGNOSIS — M23329 Other meniscus derangements, posterior horn of medial meniscus, unspecified knee: Secondary | ICD-10-CM

## 2012-12-20 DIAGNOSIS — M549 Dorsalgia, unspecified: Secondary | ICD-10-CM

## 2012-12-20 DIAGNOSIS — M25572 Pain in left ankle and joints of left foot: Secondary | ICD-10-CM

## 2012-12-20 DIAGNOSIS — M1710 Unilateral primary osteoarthritis, unspecified knee: Secondary | ICD-10-CM

## 2012-12-20 DIAGNOSIS — IMO0002 Reserved for concepts with insufficient information to code with codable children: Secondary | ICD-10-CM

## 2012-12-20 MED ORDER — GABAPENTIN 100 MG PO CAPS: 100.0000 mg | ORAL_CAPSULE | Freq: Three times a day (TID) | ORAL | Status: DC

## 2012-12-20 NOTE — Progress Notes (Signed)
Patient ID: Erik Wright, male   DOB: 1955/07/09, 57 y.o.   MRN: [id number]  Chief Complaint  Patient presents with  . Knee Pain    Right knee pain, no injury. Referred by Dr. Channing Mutters.   HISTORY: 57 year old male with history of lumbar disc disease went to a wedding on October 25 stood for a long period of time that started having right leg pain. He went to see his neurosurgeon and evaluation revealed eventually that he had torn medial meniscus on MRI. He's had previous knee surgery with arthroscopy and most likely torn medial meniscus is MRI shows that he has patellofemoral chondromalacia medial compartment advanced chondral thinning peripheral subchondral edema in the tibial plateau lateral compartment preserved degenerative tear posterior horn medial meniscus with extrusion and 3 compartment degenerative disease  His pain however is on the lateral side of his knee in the back of his knee he has some mild medial compartment symptoms. He denies catching locking he has some swelling intermittently and he likes to keep his knee flexed.  System review negative except for joint pain and swelling  He notes that the Percocet does help some of his knee pain.  The complaint of ankle pain at the end of the visit and wanted an x-ray he was sent to [facility] will call him with those results  BP 146/98  Ht 5\' 10"  (1.778 m)  Wt 265 lb (120.203 kg)  BMI 38.02 kg/m2 Overall appearance is normal he is oriented x3 his mood is normal he has a slightly antalgic gait  On inspection his upper extremities look normal full range of motion is noted no contracture subluxation atrophy tremor or skin changes neurovascular exam intact with normal lymph nodes  The left knee has full range of motion strength is normal stability tests normal skin normal pulses normal sensation normal to inspection no effusion no tenderness  Right knee he holds it flexed he has pain in the back of his knee when his knee is extended he has  lateral soft tissue tenderness mild medial joint line tenderness negative McMurray sign full knee flexion passively ligaments are stable no effusion is noted  Has good sensation distally normal reflexes no lymphadenopathy  He complains of chronic venous insufficiency but has no swelling today  Overall balance assessment is normal  Radicular right leg pain Osteoarthritis right knee Old medial meniscal tear right knee Left ankle pain  X-ray shows moderate arthrosis medial compartment. MRI shows torn meniscus which is most likely related to his previous surgery as he has had no acute injury no twisting injury and has no mechanical symptoms just pain  Some of his pain is lateral and posterior, start him on gabapentin 100 mg 3 times a day;  I will inject him for his knee arthritis he will come back in 6 weeks  We will get an [facility] at [facility] on the ankle and I will refer him if needed the foot and ankle specialist  Knee  Injection Procedure Note  Pre-operative Diagnosis: right knee oa  Post-operative Diagnosis: same  Indications: pain  Anesthesia: ethyl chloride   Procedure Details   Verbal consent was obtained for the procedure. Time out was completed.The joint was prepped with alcohol, followed by  Ethyl chloride spray and A 20 gauge needle was inserted into the knee via lateral approach; 4ml 1% lidocaine and 1 ml of depomedrol  was then injected into the joint . The needle was removed and the area cleansed and dressed.  Complications:  None; patient tolerated the procedure well.

## 2012-12-20 NOTE — Patient Instructions (Addendum)
Joint Injection  Care After  Refer to this sheet in the next few days. These instructions provide you with information on caring for yourself after you have had a joint injection. Your caregiver also may give you more specific instructions. Your treatment has been planned according to current medical practices, but problems sometimes occur. Call your caregiver if you have any problems or questions after your procedure.  After any type of joint injection, it is not uncommon to experience:  Soreness, swelling, or bruising around the injection site.  Mild numbness, tingling, or weakness around the injection site caused by the numbing medicine used before or with the injection. It also is possible to experience the following effects associated with the specific agent after injection:  Iodine-based contrast agents:  Allergic reaction (itching, hives, widespread redness, and swelling beyond the injection site).  Corticosteroids (These effects are rare.):  Allergic reaction.  Increased blood sugar levels (If you have diabetes and you notice that your blood sugar levels have increased, notify your caregiver).  Increased blood pressure levels.  Mood swings.  Hyaluronic acid in the use of viscosupplementation.  Temporary heat or redness.  Temporary rash and itching.  Increased fluid accumulation in the injected joint. These effects all should resolve within a day after your procedure.  HOME CARE INSTRUCTIONS  Limit yourself to light activity the day of your procedure. Avoid lifting heavy objects, bending, stooping, or twisting.  Take prescription or over-the-counter pain medication as directed by your caregiver.  You may apply ice to your injection site to reduce pain and swelling the day of your procedure. Ice may be applied 3-4 times:  Put ice in a plastic bag.  Place a towel between your skin and the bag.  Leave the ice on for no longer than 15-20 minutes each time. SEEK IMMEDIATE MEDICAL CARE IF:   Pain and swelling get worse rather than better or extend beyond the injection site.  Numbness does not go away.  Blood or fluid continues to leak from the injection site.  You have chest pain.  You have swelling of your face or tongue.  You have trouble breathing or you become dizzy.  You develop a fever, chills, or severe tenderness at the injection site that last longer than 1 day. MAKE SURE YOU:  Understand these instructions.  Watch your condition.  Get help right away if you are not doing well or if you get worse. Document Released: 09/03/2010 Document Revised: 03/15/2011 Document Reviewed: 09/03/2010  [facility] Patient Information 2014 [place], [place].  Doctor will call with ankle Xray report

## 2012-12-20 NOTE — Addendum Note (Signed)
Addended by: Fuller Canada E on: 12/20/2012 12:00 PM   Modules accepted: Level of Service

## 2012-12-22 ENCOUNTER — Telehealth: Payer: Self-pay | Admitting: *Deleted

## 2012-12-22 ENCOUNTER — Other Ambulatory Visit: Payer: Self-pay | Admitting: *Deleted

## 2012-12-22 DIAGNOSIS — M19079 Primary osteoarthritis, unspecified ankle and foot: Secondary | ICD-10-CM

## 2012-12-22 NOTE — Telephone Encounter (Signed)
Patient has an appointment with Dr. Nolen Mu 02/08/13 at 10:45 am. Patient is aware and was advised to take Insurance card, copay,med list,arrive early, and if he has to change appointment was told to call their office, number provided.

## 2013-02-01 ENCOUNTER — Other Ambulatory Visit: Payer: Self-pay | Admitting: *Deleted

## 2013-02-01 ENCOUNTER — Ambulatory Visit (INDEPENDENT_AMBULATORY_CARE_PROVIDER_SITE_OTHER): Payer: Federal, State, Local not specified - PPO | Admitting: Orthopedic Surgery

## 2013-02-01 VITALS — BP 129/93 | Ht 70.0 in | Wt 265.0 lb

## 2013-02-01 DIAGNOSIS — M23329 Other meniscus derangements, posterior horn of medial meniscus, unspecified knee: Secondary | ICD-10-CM | POA: Insufficient documentation

## 2013-02-01 DIAGNOSIS — M1710 Unilateral primary osteoarthritis, unspecified knee: Secondary | ICD-10-CM

## 2013-02-01 DIAGNOSIS — M1711 Unilateral primary osteoarthritis, right knee: Secondary | ICD-10-CM

## 2013-02-01 DIAGNOSIS — IMO0002 Reserved for concepts with insufficient information to code with codable children: Secondary | ICD-10-CM

## 2013-02-01 MED ORDER — OXYCODONE-ACETAMINOPHEN 5-325 MG PO TABS: 1.0000 | ORAL_TABLET | ORAL | Status: DC | PRN

## 2013-02-01 NOTE — Progress Notes (Signed)
Patient ID: Erik Wright, male   DOB: 04/13/1955, 58 y.o.   MRN: [id number] Chief Complaint  Patient presents with  . Follow-up    6 week recheck right knee s/p injection    BP 129/93  Ht 5\' 10"  (1.778 m)  Wt 265 lb (120.203 kg)  BMI 38.02 kg/m2  Followup visit status post injection right knee  The patient says that he did well for about 3 weeks and his pain became severe again is having difficulty walking into his leg. His MRI shows questionable medial meniscal tear osteoarthritis medial compartment severe  His pain is lateral posterior and then medial. We discussed the source of his lateral and posterior pain as referred pain. His lateral compartment is essentially normal on MRI  He says he is in such severe pain that something has to be done. I counseled him extensively regarding the possibilities of having and normal knee arthroscopy I reviewed his MRI again with him. He wishes to proceed with arthroscopic surgery of his right knee for possible torn medial meniscus he knows he has arthritis in the medial compartment and the lateral compartment is normal in his back may be the source of his pain  At the time of surgery were near that time the history and physical will be completed and is incorporated by reference  Encounter Diagnoses  Name Primary?  . Osteoarthritis of right knee Yes  . Derangement of posterior horn of medial meniscus

## 2013-02-01 NOTE — Patient Instructions (Signed)
Arthroscopic Procedure, Knee An arthroscopic procedure can find what is wrong with your knee. PROCEDURE Arthroscopy is a surgical technique that allows your orthopedic surgeon to diagnose and treat your knee injury with accuracy. They will look into your knee through a small instrument. This is almost like a small (pencil sized) telescope. Because arthroscopy affects your knee less than open knee surgery, you can anticipate a more rapid recovery. Taking an active role by following your caregiver's instructions will help with rapid and complete recovery. Use crutches, rest, elevation, ice, and knee exercises as instructed. The length of recovery depends on various factors including type of injury, age, physical condition, medical conditions, and your rehabilitation. Your knee is the joint between the large bones (femur and tibia) in your leg. Cartilage covers these bone ends which are smooth and slippery and allow your knee to bend and move smoothly. Two menisci, thick, semi-lunar shaped pads of cartilage which form a rim inside the joint, help absorb shock and stabilize your knee. Ligaments bind the bones together and support your knee joint. Muscles move the joint, help support your knee, and take stress off the joint itself. Because of this all programs and physical therapy to rehabilitate an injured or repaired knee require rebuilding and strengthening your muscles. AFTER THE PROCEDURE  After the procedure, you will be moved to a recovery area until most of the effects of the medication have worn off. Your caregiver will discuss the test results with you.   Only take over-the-counter or prescription medicines for pain, discomfort, or fever as directed by your caregiver.    You have been scheduled for arthroscocpic knee surgery.  All surgeries carry some risk.  Remember you always have the option of continued nonsurgical treatment. However in this situation the risks vs. the benefits favor surgery as  the best treatment option. The risks of the surgery includes the following but is not limited to bleeding, infection, pulmonary embolus, death from anesthesia, nerve injury vascular injury or need for further surgery, continued pain.  Specific to this procedure the following risks and complications are rare but possible Stiffness, pain, weakness, giving out  I expect  recovery will be in 3-4 weeks some patients take 6 weeks.  You  will need physical therapy after the procedure  Stop any blood thinning medication: such as warfarin, coumadin, naprosyn, ibuprofen, advil, diclofenac, aspirin

## 2013-02-02 ENCOUNTER — Other Ambulatory Visit: Payer: Self-pay

## 2013-02-02 ENCOUNTER — Encounter (HOSPITAL_COMMUNITY): Payer: Self-pay

## 2013-02-02 ENCOUNTER — Encounter (HOSPITAL_COMMUNITY)
Admission: RE | Admit: 2013-02-02 | Discharge: 2013-02-02 | Disposition: A | Discharge status: Home / Self Care | Payer: Federal, State, Local not specified - PPO | Source: Ambulatory Visit | Attending: Orthopedic Surgery | Admitting: Orthopedic Surgery

## 2013-02-02 DIAGNOSIS — Z01812 Encounter for preprocedural laboratory examination: Secondary | ICD-10-CM | POA: Insufficient documentation

## 2013-02-02 DIAGNOSIS — Z01818 Encounter for other preprocedural examination: Secondary | ICD-10-CM | POA: Insufficient documentation

## 2013-02-02 DIAGNOSIS — Z01810 Encounter for preprocedural cardiovascular examination: Secondary | ICD-10-CM | POA: Insufficient documentation

## 2013-02-02 HISTORY — DX: Anxiety disorder, unspecified: F41.9

## 2013-02-02 HISTORY — DX: Personal history of other diseases of the musculoskeletal system and connective tissue: Z87.39

## 2013-02-02 HISTORY — DX: Pure hypercholesterolemia, unspecified: E78.00

## 2013-02-02 HISTORY — DX: Essential (primary) hypertension: I10

## 2013-02-02 HISTORY — DX: Venous insufficiency (chronic) (peripheral): I87.2

## 2013-02-02 LAB — BASIC METABOLIC PANEL
BUN: 21 mg/dL (ref 6–23)
CHLORIDE: 98 meq/L (ref 96–112)
CO2: 34 mEq/L — ABNORMAL HIGH (ref 19–32)
Calcium: 10 mg/dL (ref 8.4–10.5)
Creatinine, Ser: 1.12 mg/dL (ref 0.50–1.35)
GFR, EST AFRICAN AMERICAN: 82 mL/min — AB (ref >90)
GFR, EST NON AFRICAN AMERICAN: 71 mL/min — AB (ref >90)
Glucose, Bld: 107 mg/dL — ABNORMAL HIGH (ref 70–99)
POTASSIUM: 4.3 meq/L (ref 3.7–5.3)
SODIUM: 141 meq/L (ref 137–147)

## 2013-02-02 LAB — HEMOGLOBIN AND HEMATOCRIT, BLOOD
HCT: 47.8 % (ref 39.0–52.0)
Hemoglobin: 16.3 g/dL (ref 13.0–17.0)

## 2013-02-02 NOTE — Progress Notes (Signed)
02/02/13 1336  OBSTRUCTIVE SLEEP APNEA  Have you ever been diagnosed with sleep apnea through a sleep study? No  Do you snore loudly (loud enough to be heard through closed doors)?  0  Do you often feel tired, fatigued, or sleepy during the daytime? 0  Has anyone observed you stop breathing during your sleep? 0  Do you have, or are you being treated for high blood pressure? 1  BMI more than 35 kg/m2? 1  Age over 58 years old? 1  Neck circumference greater than 40 cm/18 inches? 1  Gender: 1  Obstructive Sleep Apnea Score 5  Score 4 or greater  Results sent to PCP

## 2013-02-02 NOTE — Patient Instructions (Signed)
Erik Wright  02/02/2013   Your procedure is scheduled on:   02/09/2013  Report to [facility] at  1000  AM.  Call this number if you have problems the morning of surgery: [phone]   Remember:   Do not eat food or drink liquids after midnight.   Take these medicines the morning of surgery with A SIP OF WATER:  Chlorthalidone, gabapentin, oxycodone, paxil   Do not wear jewelry, make-up or nail polish.  Do not wear lotions, powders, or perfumes.   Do not shave 48 hours prior to surgery. Men may shave face and neck.  Do not bring valuables to the hospital.  [facility] any belongings or valuables.               Contacts, dentures or bridgework may not be worn into surgery.  Leave suitcase in the car. After surgery it may be brought to your room.  For patients admitted to the hospital, discharge time is determined by your treatment team.               Patients discharged the day of surgery will not be allowed to drive home.  Name and phone number of your driver: family  Special Instructions: Shower using CHG 2 nights before surgery and the night before surgery.  If you shower the day of surgery use CHG.  Use special wash - you have one bottle of CHG for all showers.  You should use approximately 1/3 of the bottle for each shower.   Please read over the following fact sheets that you were given: Pain Booklet, Coughing and Deep Breathing, Surgical Site Infection Prevention, Anesthesia Post-op Instructions and Care and Recovery After Surgery Arthroscopic Procedure, Knee An arthroscopic procedure can find what is wrong with your knee. PROCEDURE Arthroscopy is a surgical technique that allows your orthopedic surgeon to diagnose and treat your knee injury with accuracy. They will look into your knee through a small instrument. This is almost like a small (pencil sized) telescope. Because arthroscopy affects your knee less than open knee surgery, you can anticipate  a more rapid recovery. Taking an active role by following your caregiver's instructions will help with rapid and complete recovery. Use crutches, rest, elevation, ice, and knee exercises as instructed. The length of recovery depends on various factors including type of injury, age, physical condition, medical conditions, and your rehabilitation. Your knee is the joint between the large bones (femur and tibia) in your leg. Cartilage covers these bone ends which are smooth and slippery and allow your knee to bend and move smoothly. Two menisci, thick, semi-lunar shaped pads of cartilage which form a rim inside the joint, help absorb shock and stabilize your knee. Ligaments bind the bones together and support your knee joint. Muscles move the joint, help support your knee, and take stress off the joint itself. Because of this all programs and physical therapy to rehabilitate an injured or repaired knee require rebuilding and strengthening your muscles. AFTER THE PROCEDURE  After the procedure, you will be moved to a recovery area until most of the effects of the medication have worn off. Your caregiver will discuss the test results with you.  Only take over-the-counter or prescription medicines for pain, discomfort, or fever as directed by your caregiver. SEEK MEDICAL CARE IF:   You have increased bleeding from your wounds.  You see redness, swelling, or have increasing pain in your wounds.  You have  pus coming from your wound.  You have an oral temperature above 102 F (38.9 C).  You notice a bad smell coming from the wound or dressing.  You have severe pain with any motion of your knee. SEEK IMMEDIATE MEDICAL CARE IF:   You develop a rash.  You have difficulty breathing.  You have any allergic problems. Document Released: 12/19/1999 Document Revised: 03/15/2011 Document Reviewed: 07/12/2007 [facility] Patient Information 2014 [place]. PATIENT  INSTRUCTIONS POST-ANESTHESIA  IMMEDIATELY FOLLOWING SURGERY:  Do not drive or operate machinery for the first twenty four hours after surgery.  Do not make any important decisions for twenty four hours after surgery or while taking narcotic pain medications or sedatives.  If you develop intractable nausea and vomiting or a severe headache please notify your doctor immediately.  FOLLOW-UP:  Please make an appointment with your surgeon as instructed. You do not need to follow up with anesthesia unless specifically instructed to do so.  WOUND CARE INSTRUCTIONS (if applicable):  Keep a dry clean dressing on the anesthesia/puncture wound site if there is drainage.  Once the wound has quit draining you may leave it open to air.  Generally you should leave the bandage intact for twenty four hours unless there is drainage.  If the epidural site drains for more than 36-48 hours please call the anesthesia department.  QUESTIONS?:  Please feel free to call your physician or the hospital operator if you have any questions, and they will be happy to assist you.

## 2013-02-08 NOTE — H&P (Addendum)
Patient ID: Erik Wright, male   DOB: 1955-06-26, 58 y.o.   MRN: [id number]    Chief Complaint   Patient presents with   .  Knee Pain       Right knee pain, no injury. Referred by Dr. Channing Mutters.    HISTORY: 58 year old male with history of lumbar disc disease went to a wedding on October 25 stood for a long period of time that started having right leg pain. He went to see his neurosurgeon and evaluation revealed eventually that he had torn medial meniscus on MRI. He's had previous knee surgery with arthroscopy and most likely torn medial meniscus is MRI shows that he has patellofemoral chondromalacia medial compartment advanced chondral thinning peripheral subchondral edema in the tibial plateau lateral compartment preserved degenerative tear posterior horn medial meniscus with extrusion and 3 compartment degenerative disease  His pain however is on the lateral side of his knee in the back of his knee he has some mild medial compartment symptoms. He denies catching locking he has some swelling intermittently and he likes to keep his knee flexed.   The patient says that he did well for about 3 weeks and his pain became severe again is having difficulty walking into his leg. His MRI shows questionable medial meniscal tear osteoarthritis medial compartment severe  His pain is lateral posterior and then medial. We discussed the source of his lateral and posterior pain as referred pain. His lateral compartment is essentially normal on MRI  He says he is in such severe pain that something has to be done. I counseled him extensively regarding the possibilities of having and normal knee arthroscopy I reviewed his MRI again with him. He wishes to proceed with arthroscopic surgery of his right knee for possible torn medial meniscus he knows he has arthritis in the medial compartment and the lateral compartment is normal in his back may be the source of his pain   System review negative except for joint pain  and swelling  He notes that the Percocet does help some of his knee pain.  The complaint of ankle pain at the end of the visit and wanted an x-ray he was sent to [facility] will call him with those results  Past Medical History  Diagnosis Date  . Hypertension   . Anxiety   . Chronic venous insufficiency   . Hypercholesteremia   . History of gout    Past Surgical History  Procedure Laterality Date  . Laparoscopic gastric banding    . Back surgery      lumbar disc   No family history on file. History  Substance Use Topics  . Smoking status: Former Smoker -- 1.00 packs/day for 35 years    Types: Cigarettes    Quit date: 12/24/2012  . Smokeless tobacco: Not on file  . Alcohol Use: Yes     Comment: Occ    BP 146/98  Ht 5\' 10"  (1.778 m)  Wt 265 lb (120.203 kg)  BMI 38.02 kg/m2 Overall appearance is normal he is oriented x3 his mood is normal he has a slightly antalgic gait  On inspection his upper extremities look normal full range of motion is noted no contracture subluxation atrophy tremor or skin changes neurovascular exam intact with normal lymph nodes  The left knee has full range of motion strength is normal stability tests normal skin normal pulses normal sensation normal to inspection no effusion no tenderness  Right knee he holds it flexed he has pain in  the back of his knee when his knee is extended he has lateral soft tissue tenderness mild medial joint line tenderness negative McMurray sign full knee flexion passively ligaments are stable no effusion is noted  Has good sensation distally normal reflexes no lymphadenopathy  He complains of chronic venous insufficiency but has no swelling today  Overall balance assessment is normal  Radicular right leg pain Osteoarthritis right knee Old medial meniscal tear right knee Left ankle pain  X-ray shows moderate arthrosis medial compartment. MRI shows torn meniscus which is most likely related to his previous surgery as he  has had no acute injury no twisting injury and has no mechanical symptoms just pain  Diagnosis osteoarthritis right knee posterior horn medial meniscus derangement  Plan arthroscopy right knee possible partial medial meniscectomy  The patient is fully aware and has agreed to surgery understanding that he may not get any pain relief

## 2013-02-09 ENCOUNTER — Encounter (HOSPITAL_COMMUNITY)
Admission: RE | Disposition: A | Discharge status: Home / Self Care | Payer: Self-pay | Source: Ambulatory Visit | Attending: Orthopedic Surgery

## 2013-02-09 ENCOUNTER — Ambulatory Visit (HOSPITAL_COMMUNITY)
Admission: RE | Admit: 2013-02-09 | Discharge: 2013-02-09 | Disposition: A | Discharge status: Home / Self Care | Payer: Federal, State, Local not specified - PPO | Source: Ambulatory Visit | Attending: Orthopedic Surgery | Admitting: Orthopedic Surgery

## 2013-02-09 ENCOUNTER — Encounter (HOSPITAL_COMMUNITY): Payer: Federal, State, Local not specified - PPO | Admitting: Anesthesiology

## 2013-02-09 ENCOUNTER — Encounter (HOSPITAL_COMMUNITY): Payer: Self-pay | Admitting: *Deleted

## 2013-02-09 ENCOUNTER — Ambulatory Visit (HOSPITAL_COMMUNITY): Payer: Federal, State, Local not specified - PPO | Admitting: Anesthesiology

## 2013-02-09 DIAGNOSIS — I10 Essential (primary) hypertension: Secondary | ICD-10-CM | POA: Insufficient documentation

## 2013-02-09 DIAGNOSIS — F411 Generalized anxiety disorder: Secondary | ICD-10-CM | POA: Insufficient documentation

## 2013-02-09 DIAGNOSIS — M1710 Unilateral primary osteoarthritis, unspecified knee: Secondary | ICD-10-CM | POA: Insufficient documentation

## 2013-02-09 DIAGNOSIS — IMO0002 Reserved for concepts with insufficient information to code with codable children: Secondary | ICD-10-CM | POA: Insufficient documentation

## 2013-02-09 DIAGNOSIS — Z79899 Other long term (current) drug therapy: Secondary | ICD-10-CM | POA: Insufficient documentation

## 2013-02-09 DIAGNOSIS — M9420 Chondromalacia, unspecified site: Secondary | ICD-10-CM | POA: Insufficient documentation

## 2013-02-09 DIAGNOSIS — X58XXXA Exposure to other specified factors, initial encounter: Secondary | ICD-10-CM | POA: Insufficient documentation

## 2013-02-09 DIAGNOSIS — M1711 Unilateral primary osteoarthritis, right knee: Secondary | ICD-10-CM

## 2013-02-09 DIAGNOSIS — M23329 Other meniscus derangements, posterior horn of medial meniscus, unspecified knee: Secondary | ICD-10-CM

## 2013-02-09 DIAGNOSIS — E7800 Pure hypercholesterolemia, unspecified: Secondary | ICD-10-CM | POA: Insufficient documentation

## 2013-02-09 HISTORY — PX: KNEE ARTHROSCOPY WITH MEDIAL MENISECTOMY: SHX5651

## 2013-02-09 SURGERY — ARTHROSCOPY, KNEE, WITH MEDIAL MENISCECTOMY
Anesthesia: General | Site: Knee | Laterality: Right

## 2013-02-09 MED ORDER — GLYCOPYRROLATE 0.2 MG/ML IJ SOLN
INTRAMUSCULAR | Status: AC
Start: 2013-02-09 — End: 2013-02-09
  Filled 2013-02-09: qty 1

## 2013-02-09 MED ORDER — SODIUM CHLORIDE 0.9 % IR SOLN
Status: DC | PRN
  Administered 2013-02-09: 500 mL

## 2013-02-09 MED ORDER — OXYCODONE HCL 5 MG PO TABS
ORAL_TABLET | ORAL | Status: AC
  Filled 2013-02-09: qty 1

## 2013-02-09 MED ORDER — LIDOCAINE HCL 1 % IJ SOLN
INTRAMUSCULAR | Status: DC | PRN
  Administered 2013-02-09: 50 mg via INTRADERMAL

## 2013-02-09 MED ORDER — PROPOFOL 10 MG/ML IV BOLUS
INTRAVENOUS | Status: DC | PRN
  Administered 2013-02-09: 200 mg via INTRAVENOUS

## 2013-02-09 MED ORDER — MIDAZOLAM HCL 5 MG/5ML IJ SOLN
INTRAMUSCULAR | Status: DC | PRN
  Administered 2013-02-09: 2 mg via INTRAVENOUS

## 2013-02-09 MED ORDER — DEXTROSE 5 % IV SOLN
3.0000 g | INTRAVENOUS | Status: DC | PRN
  Administered 2013-02-09: 3 g via INTRAVENOUS

## 2013-02-09 MED ORDER — MIDAZOLAM HCL 2 MG/2ML IJ SOLN
1.0000 mg | INTRAMUSCULAR | Status: DC | PRN
  Administered 2013-02-09 (×2): 2 mg via INTRAVENOUS

## 2013-02-09 MED ORDER — ONDANSETRON HCL 4 MG/2ML IJ SOLN: 4.0000 mg | Freq: Four times a day (QID) | INTRAMUSCULAR | Status: DC

## 2013-02-09 MED ORDER — DEXTROSE 5 % IV SOLN: 3.0000 g | INTRAVENOUS | Status: DC

## 2013-02-09 MED ORDER — FENTANYL CITRATE 0.05 MG/ML IJ SOLN
25.0000 ug | INTRAMUSCULAR | Status: AC
  Administered 2013-02-09: 25 ug via INTRAVENOUS
  Administered 2013-02-09: 50 ug via INTRAVENOUS
  Administered 2013-02-09: 25 ug via INTRAVENOUS

## 2013-02-09 MED ORDER — MIDAZOLAM HCL 2 MG/2ML IJ SOLN
INTRAMUSCULAR | Status: AC
  Filled 2013-02-09: qty 2

## 2013-02-09 MED ORDER — CHLORHEXIDINE GLUCONATE 4 % EX LIQD: 60.0000 mL | Freq: Once | CUTANEOUS | Status: DC

## 2013-02-09 MED ORDER — PROPOFOL 10 MG/ML IV BOLUS
INTRAVENOUS | Status: AC
  Filled 2013-02-09: qty 20

## 2013-02-09 MED ORDER — FENTANYL CITRATE 0.05 MG/ML IJ SOLN
INTRAMUSCULAR | Status: AC
  Filled 2013-02-09: qty 2

## 2013-02-09 MED ORDER — BUPIVACAINE-EPINEPHRINE PF 0.5-1:200000 % IJ SOLN
INTRAMUSCULAR | Status: AC
  Filled 2013-02-09: qty 20

## 2013-02-09 MED ORDER — ONDANSETRON HCL 4 MG/2ML IJ SOLN
4.0000 mg | Freq: Once | INTRAMUSCULAR | Status: AC
  Administered 2013-02-09: 4 mg via INTRAVENOUS

## 2013-02-09 MED ORDER — SODIUM CHLORIDE 0.9 % IR SOLN
Status: DC | PRN
  Administered 2013-02-09 (×3)

## 2013-02-09 MED ORDER — LACTATED RINGERS IV SOLN
INTRAVENOUS | Status: DC
  Administered 2013-02-09: 1000 mL via INTRAVENOUS
  Administered 2013-02-09: 14:00:00 via INTRAVENOUS

## 2013-02-09 MED ORDER — ONDANSETRON HCL 4 MG/2ML IJ SOLN
INTRAMUSCULAR | Status: AC
  Filled 2013-02-09: qty 2

## 2013-02-09 MED ORDER — CEFAZOLIN SODIUM 1-5 GM-% IV SOLN
1.0000 g | INTRAVENOUS | Status: DC
  Filled 2013-02-09: qty 50

## 2013-02-09 MED ORDER — OXYCODONE-ACETAMINOPHEN 10-325 MG PO TABS: 1.0000 | ORAL_TABLET | ORAL | Status: DC | PRN

## 2013-02-09 MED ORDER — ONDANSETRON HCL 4 MG/2ML IJ SOLN
4.0000 mg | Freq: Once | INTRAMUSCULAR | Status: AC | PRN
  Administered 2013-02-09: 4 mg via INTRAVENOUS
  Filled 2013-02-09: qty 2

## 2013-02-09 MED ORDER — BUPIVACAINE-EPINEPHRINE PF 0.5-1:200000 % IJ SOLN
INTRAMUSCULAR | Status: DC | PRN
  Administered 2013-02-09: 60 mL via PERINEURAL

## 2013-02-09 MED ORDER — LIDOCAINE HCL (PF) 1 % IJ SOLN
INTRAMUSCULAR | Status: AC
  Filled 2013-02-09: qty 5

## 2013-02-09 MED ORDER — FENTANYL CITRATE 0.05 MG/ML IJ SOLN: 25.0000 ug | INTRAMUSCULAR | Status: DC | PRN

## 2013-02-09 MED ORDER — KETOROLAC TROMETHAMINE 30 MG/ML IJ SOLN
INTRAMUSCULAR | Status: AC
  Filled 2013-02-09: qty 1

## 2013-02-09 MED ORDER — EPINEPHRINE HCL 1 MG/ML IJ SOLN
INTRAMUSCULAR | Status: AC
  Filled 2013-02-09: qty 5

## 2013-02-09 MED ORDER — GLYCOPYRROLATE 0.2 MG/ML IJ SOLN
0.2000 mg | Freq: Once | INTRAMUSCULAR | Status: AC
  Administered 2013-02-09: 0.2 mg via INTRAVENOUS

## 2013-02-09 MED ORDER — FENTANYL CITRATE 0.05 MG/ML IJ SOLN
INTRAMUSCULAR | Status: DC | PRN
  Administered 2013-02-09: 50 ug via INTRAVENOUS
  Administered 2013-02-09 (×2): 25 ug via INTRAVENOUS
  Administered 2013-02-09 (×2): 50 ug via INTRAVENOUS

## 2013-02-09 MED ORDER — OXYCODONE HCL 5 MG PO TABS
5.0000 mg | ORAL_TABLET | ORAL | Status: DC
  Administered 2013-02-09: 5 mg via ORAL

## 2013-02-09 MED ORDER — CEFAZOLIN SODIUM-DEXTROSE 2-3 GM-% IV SOLR
2.0000 g | INTRAVENOUS | Status: DC
  Filled 2013-02-09: qty 50

## 2013-02-09 SURGICAL SUPPLY — 62 items
ARTHROWAND PARAGON T2 (SURGICAL WAND)
BAG HAMPER (MISCELLANEOUS) ×2 IMPLANT
BANDAGE ELASTIC 6 VELCRO NS (GAUZE/BANDAGES/DRESSINGS) ×2 IMPLANT
BANDAGE ELASTIC 6 VELCRO ST LF (GAUZE/BANDAGES/DRESSINGS) ×1 IMPLANT
BLADE AGGRESSIVE PLUS 4.0 (BLADE) ×2 IMPLANT
BLADE SURG SZ11 CARB STEEL (BLADE) ×2 IMPLANT
CHLORAPREP W/TINT 26ML (MISCELLANEOUS) ×4 IMPLANT
CLOTH BEACON ORANGE TIMEOUT ST (SAFETY) ×2 IMPLANT
COOLER CRYO IC GRAV AND TUBE (ORTHOPEDIC SUPPLIES) ×2 IMPLANT
CUFF CRYO KNEE LG 20X31 COOLER (ORTHOPEDIC SUPPLIES) ×1 IMPLANT
CUFF CRYO KNEE18X23 MED (MISCELLANEOUS) IMPLANT
CUFF TOURNIQUET SINGLE 34IN LL (TOURNIQUET CUFF) ×1 IMPLANT
CUFF TOURNIQUET SINGLE 44IN (TOURNIQUET CUFF) IMPLANT
CUTTER ANGLED DBL BITE 4.5 (BURR) IMPLANT
DECANTER SPIKE VIAL GLASS SM (MISCELLANEOUS) ×4 IMPLANT
ELECT REM PT RETURN 9FT ADLT (ELECTROSURGICAL) ×2
ELECTRODE REM PT RTRN 9FT ADLT (ELECTROSURGICAL) IMPLANT
GAUZE SPONGE 4X4 16PLY XRAY LF (GAUZE/BANDAGES/DRESSINGS) ×2 IMPLANT
GAUZE XEROFORM 5X9 LF (GAUZE/BANDAGES/DRESSINGS) ×2 IMPLANT
GLOVE INDICATOR 7.0 STRL GRN (GLOVE) ×1 IMPLANT
GLOVE SKINSENSE NS SZ8.0 LF (GLOVE) ×1
GLOVE SKINSENSE STRL SZ8.0 LF (GLOVE) ×1 IMPLANT
GLOVE SS BIOGEL STRL SZ 6.5 (GLOVE) IMPLANT
GLOVE SS N UNI LF 8.5 STRL (GLOVE) ×2 IMPLANT
GLOVE SUPERSENSE BIOGEL SZ 6.5 (GLOVE) ×1
GOWN STRL REUS W/TWL LRG LVL3 (GOWN DISPOSABLE) ×6 IMPLANT
GOWN STRL REUS W/TWL XL LVL3 (GOWN DISPOSABLE) ×2 IMPLANT
HLDR LEG FOAM (MISCELLANEOUS) ×1 IMPLANT
IV NS IRRIG 3000ML ARTHROMATIC (IV SOLUTION) ×5 IMPLANT
KIT BLADEGUARD II DBL (SET/KITS/TRAYS/PACK) ×2 IMPLANT
KIT ROOM TURNOVER AP CYSTO (KITS) ×2 IMPLANT
LEG HOLDER FOAM (MISCELLANEOUS) ×1
MANIFOLD NEPTUNE II (INSTRUMENTS) ×2 IMPLANT
MARKER SKIN DUAL TIP RULER LAB (MISCELLANEOUS) ×2 IMPLANT
NDL HYPO 18GX1.5 BLUNT FILL (NEEDLE) ×1 IMPLANT
NDL HYPO 21X1.5 SAFETY (NEEDLE) ×1 IMPLANT
NDL SPNL 18GX3.5 QUINCKE PK (NEEDLE) ×1 IMPLANT
NEEDLE HYPO 18GX1.5 BLUNT FILL (NEEDLE) ×2 IMPLANT
NEEDLE HYPO 21X1.5 SAFETY (NEEDLE) ×2 IMPLANT
NEEDLE SPNL 18GX3.5 QUINCKE PK (NEEDLE) ×2 IMPLANT
NS IRRIG 1000ML POUR BTL (IV SOLUTION) ×2 IMPLANT
PACK ARTHRO LIMB DRAPE STRL (MISCELLANEOUS) ×2 IMPLANT
PAD ABD 5X9 TENDERSORB (GAUZE/BANDAGES/DRESSINGS) ×2 IMPLANT
PAD ARMBOARD 7.5X6 YLW CONV (MISCELLANEOUS) ×2 IMPLANT
PADDING CAST COTTON 6X4 STRL (CAST SUPPLIES) ×2 IMPLANT
PADDING WEBRIL 6 STERILE (GAUZE/BANDAGES/DRESSINGS) ×1 IMPLANT
PENCIL HANDSWITCHING (ELECTRODE) ×1 IMPLANT
SET ARTHROSCOPY INST (INSTRUMENTS) ×2 IMPLANT
SET ARTHROSCOPY PUMP TUBE (IRRIGATION / IRRIGATOR) ×2 IMPLANT
SET BASIN LINEN APH (SET/KITS/TRAYS/PACK) ×2 IMPLANT
SPONGE GAUZE 4X4 12PLY (GAUZE/BANDAGES/DRESSINGS) ×2 IMPLANT
SUT CHROMIC 0 CT 1 (SUTURE) ×1 IMPLANT
SUT CHROMIC 2 0 SH (SUTURE) ×1 IMPLANT
SUT ETHILON 3 0 FSL (SUTURE) ×1 IMPLANT
SYR 30ML LL (SYRINGE) ×2 IMPLANT
SYRINGE 10CC LL (SYRINGE) ×2 IMPLANT
WAND 50 DEG COVAC W/CORD (SURGICAL WAND) IMPLANT
WAND 90 DEG TURBOVAC W/CORD (SURGICAL WAND) ×1 IMPLANT
WAND ARTHRO PARAGON T2 (SURGICAL WAND) IMPLANT
YANKAUER SUCT 12FT TUBE ARGYLE (SUCTIONS) ×1 IMPLANT
YANKAUER SUCT BULB TIP 10FT TU (MISCELLANEOUS) ×7 IMPLANT
YANKAUER SUCT BULB TIP NO VENT (SUCTIONS) ×1 IMPLANT

## 2013-02-09 NOTE — Brief Op Note (Addendum)
02/09/2013  2:16 PM  PATIENT:  Erik Wright  58 y.o. 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  PROCEDURE:  Procedure(s): KNEE ARTHROSCOPY WITH MEDIAL MENISECTOMY with limited debridement (Right) Chondroplasty trochlea Limited debridement which included the following:  Chondroplasty medial femoral condyle Debridement and synovectomy suprapatellar pouch and medial compartment  SURGEON:  Surgeon(s) and Role:    * Vickki HearingStanley E Bridget Westbrooks, MD - Primary  PHYSICIAN ASSISTANT:   ASSISTANTS: none   ANESTHESIA:   general  EBL:  Total I/O In: 1000 [I.V.:1000] Out: -   BLOOD ADMINISTERED:none  DRAINS: none   LOCAL MEDICATIONS USED:  MARCAINE     SPECIMEN:  No Specimen  DISPOSITION OF SPECIMEN:  N/A  COUNTS:  YES  TOURNIQUET:    DICTATION: .Dragon Dictation  PLAN OF CARE: Discharge to home after PACU  PATIENT DISPOSITION:  PACU - hemodynamically stable.   Delay start of Pharmacological VTE agent (>24hrs) due to surgical blood loss or risk of bleeding: not applicable  The patient identified her preop area site marking was confirmed chart was reviewed patient was taken to surgery. General anesthesia was administered. Well leg placed in a well leg holder with padding. Right leg placed in arthroscopic leg holder. Sterile prep and drape completed. Timeout completed.  Standard lateral portal was established scope was placed into the joint. Diagnostic arthroscopy was performed circumferentially viewing the joint. Findings are noted above. Second diagnostic arthroscopy was performed using appropriate medial portal  Address the medial meniscus first. The meniscus was morselized and the  portion that was torn and the fragments were removed with a motorized shaver. The meniscus was balanced with a 90 ArthroCare wand.  Chondroplasty was performed medial femoral condyle for grade 2 chondromalacia  Anterior cruciate ligament tear was debrided near the posterior horn attachment of the lateral meniscus, remaining anterior cruciate ligament tissue is intact  Free edges of the lateral meniscus were torn and were debrided  Trochlea was  debrided with a motorized shaver and ArthroCare wand 90  The standard washings mode of the pump was used to clear debris. Lateral portal was closed with 3-0 nylon. The medial portal continued to bleed, the bleeder in the medial portal bled excessively and an arthrotomy had to be performed to confine the bleeding. Cautery and 3 chromic sutures using stick tie method were used to control the bleeding. 2-0 chromic was then used to close the subcutaneous tissue and 4 nylon sutures were placed to close the portal  The portals will stay closed with suture for 10 days

## 2013-02-09 NOTE — Transfer of Care (Signed)
Immediate Anesthesia Transfer of Care Note  Patient: Erik Wright  Procedure(s) Performed: Procedure(s): KNEE ARTHROSCOPY WITH MEDIAL MENISECTOMY with limited debridement (Right)  Patient Location: PACU  Anesthesia Type:General  Level of Consciousness: awake and patient cooperative  Airway & Oxygen Therapy: Patient Spontanous Breathing and Patient connected to face mask oxygen  Post-op Assessment: Report given to PACU RN, Post -op Vital signs reviewed and stable and Patient moving all extremities  Post vital signs: Reviewed and stable  Complications: No apparent anesthesia complications

## 2013-02-09 NOTE — Op Note (Signed)
02/09/2013  2:16 PM  PATIENT:  Erik Wright  58 y.o. 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  PROCEDURE:  Procedure(s): KNEE ARTHROSCOPY WITH MEDIAL MENISECTOMY with limited debridement (Right) Chondroplasty trochlea Limited debridement which included the following:  Chondroplasty medial femoral condyle Debridement and synovectomy suprapatellar pouch and medial compartment  SURGEON:  Surgeon(s) and Role:    * Vickki HearingStanley E Gaye Scorza, MD - Primary  PHYSICIAN ASSISTANT:   ASSISTANTS: none   ANESTHESIA:   general  EBL:  Total I/O In: 1000 [I.V.:1000] Out: -   BLOOD ADMINISTERED:none  DRAINS: none   LOCAL MEDICATIONS USED:  MARCAINE     SPECIMEN:  No Specimen  DISPOSITION OF SPECIMEN:  N/A  COUNTS:  YES  TOURNIQUET:    DICTATION: .Dragon Dictation  PLAN OF CARE: Discharge to home after PACU  PATIENT DISPOSITION:  PACU - hemodynamically stable.   Delay start of Pharmacological VTE agent (>24hrs) due to surgical blood loss or risk of bleeding: not applicable  The patient identified her preop area site marking was confirmed chart was reviewed patient was taken to surgery. General anesthesia was administered. Well leg placed in a well leg holder with padding. Right leg placed in arthroscopic leg holder. Sterile prep and drape completed. Timeout completed.  Standard lateral portal was established scope was placed into the joint. Diagnostic arthroscopy was performed circumferentially viewing the joint. Findings are noted above. Second diagnostic arthroscopy was performed using appropriate medial portal  Address the medial meniscus first. The meniscus was morselized and the  portion that was torn and the fragments were removed with a motorized shaver. The meniscus was balanced with a 90 ArthroCare wand.  Chondroplasty was performed medial femoral condyle for grade 2 chondromalacia  Anterior cruciate ligament tear was debrided near the posterior horn attachment of the lateral meniscus, remaining anterior cruciate ligament tissue is intact  Free edges of the lateral meniscus were torn and were debrided  Trochlea was  debrided with a motorized shaver and ArthroCare wand 90  The standard washings mode of the pump was used to clear debris. Lateral portal was closed with 3-0 nylon. The medial portal continued to bleed, the bleeder in the medial portal bled excessively and an arthrotomy had to be performed to confine the bleeding. Cautery and 3 chromic sutures using stick tie method were used to control the bleeding. 2-0 chromic was then used to close the subcutaneous tissue and 4 nylon sutures were placed to close the portal  The portals will stay closed with suture for 10 days  Sterile dressings and Cryo/Cuff were placed the patient was extubated taken recovery in stable condition

## 2013-02-09 NOTE — Anesthesia Preprocedure Evaluation (Signed)
Anesthesia Evaluation  Patient identified by MRN, date of birth, ID band Patient awake    Reviewed: Allergy & Precautions, H&P , NPO status , Patient's Chart, lab work & pertinent test results  Airway Mallampati: II TM Distance: >3 FB     Dental  (+) Teeth Intact   Pulmonary former smoker,  breath sounds clear to auscultation        Cardiovascular hypertension, Pt. on medications + Peripheral Vascular Disease Rhythm:Regular Rate:Normal     Neuro/Psych PSYCHIATRIC DISORDERS Anxiety    GI/Hepatic   Endo/Other    Renal/GU      Musculoskeletal   Abdominal   Peds  Hematology   Anesthesia Other Findings   Reproductive/Obstetrics                           Anesthesia Physical Anesthesia Plan  ASA: II  Anesthesia Plan: General   Post-op Pain Management:    Induction: Intravenous  Airway Management Planned: LMA  Additional Equipment:   Intra-op Plan:   Post-operative Plan: Extubation in OR  Informed Consent: I have reviewed the patients History and Physical, chart, labs and discussed the procedure including the risks, benefits and alternatives for the proposed anesthesia with the patient or authorized representative who has indicated his/her understanding and acceptance.     Plan Discussed with:   Anesthesia Plan Comments:         Anesthesia Quick Evaluation

## 2013-02-09 NOTE — Anesthesia Procedure Notes (Signed)
Procedure Name: LMA Insertion Date/Time: 02/09/2013 12:55 PM Performed by: Despina HiddenIDACAVAGE, Erik Malina J Pre-anesthesia Checklist: Patient being monitored, Suction available, Emergency Drugs available and Patient identified Patient Re-evaluated:Patient Re-evaluated prior to inductionOxygen Delivery Method: Circle system utilized Preoxygenation: Pre-oxygenation with 100% oxygen Intubation Type: IV induction Ventilation: Mask ventilation without difficulty LMA: LMA inserted LMA Size: 4.0 Tube type: Oral Number of attempts: 1 Placement Confirmation: positive ETCO2 and breath sounds checked- equal and bilateral Tube secured with: Tape Dental Injury: Teeth and Oropharynx as per pre-operative assessment

## 2013-02-09 NOTE — Interval H&P Note (Signed)
History and Physical Interval Note:  02/09/2013 11:52 AM  Erik Wright  has presented today for surgery, with the diagnosis of Right medial meniscal tear  The various methods of treatment have been discussed with the patient and family. After consideration of risks, benefits and other options for treatment, the patient has consented to  Procedure(s): KNEE ARTHROSCOPY WITH MEDIAL MENISECTOMY (Right) as a surgical intervention .  The patient's history has been reviewed, patient examined, no change in status, stable for surgery.  I have reviewed the patient's chart and labs.  Questions were answered to the patient's satisfaction.     Fuller CanadaStanley Jamarquis Crull

## 2013-02-09 NOTE — Anesthesia Postprocedure Evaluation (Signed)
  Anesthesia Post-op Note  Patient: Erik Wright  Procedure(s) Performed: Procedure(s): KNEE ARTHROSCOPY WITH MEDIAL MENISECTOMY with limited debridement (Right)  Patient Location: PACU  Anesthesia Type:General  Level of Consciousness: awake, alert , oriented and patient cooperative  Airway and Oxygen Therapy: Patient Spontanous Breathing  Post-op Pain: 3 /10, mild  Post-op Assessment: Post-op Vital signs reviewed, Patient's Cardiovascular Status Stable, Respiratory Function Stable, Patent Airway, No signs of Nausea or vomiting and Pain level controlled  Post-op Vital Signs: Reviewed and stable  Complications: No apparent anesthesia complications

## 2013-02-12 ENCOUNTER — Ambulatory Visit (INDEPENDENT_AMBULATORY_CARE_PROVIDER_SITE_OTHER): Payer: Federal, State, Local not specified - PPO | Admitting: Orthopedic Surgery

## 2013-02-12 ENCOUNTER — Encounter: Payer: Self-pay | Admitting: Orthopedic Surgery

## 2013-02-12 VITALS — BP 139/95 | Ht 70.0 in | Wt 265.0 lb

## 2013-02-12 DIAGNOSIS — M1710 Unilateral primary osteoarthritis, unspecified knee: Secondary | ICD-10-CM

## 2013-02-12 DIAGNOSIS — M1711 Unilateral primary osteoarthritis, right knee: Secondary | ICD-10-CM

## 2013-02-12 DIAGNOSIS — M23329 Other meniscus derangements, posterior horn of medial meniscus, unspecified knee: Secondary | ICD-10-CM

## 2013-02-12 DIAGNOSIS — L299 Pruritus, unspecified: Secondary | ICD-10-CM

## 2013-02-12 DIAGNOSIS — M5410 Radiculopathy, site unspecified: Secondary | ICD-10-CM

## 2013-02-12 DIAGNOSIS — IMO0002 Reserved for concepts with insufficient information to code with codable children: Secondary | ICD-10-CM

## 2013-02-12 DIAGNOSIS — M25569 Pain in unspecified knee: Secondary | ICD-10-CM

## 2013-02-12 MED ORDER — OXYCODONE-ACETAMINOPHEN 10-325 MG PO TABS: 1.0000 | ORAL_TABLET | ORAL | Status: DC | PRN

## 2013-02-12 MED ORDER — DIPHENHYDRAMINE HCL 25 MG PO CAPS: 25.0000 mg | ORAL_CAPSULE | ORAL | Status: DC | PRN

## 2013-02-12 MED ORDER — GABAPENTIN 100 MG PO CAPS: 100.0000 mg | ORAL_CAPSULE | Freq: Three times a day (TID) | ORAL | Status: DC

## 2013-02-12 NOTE — Patient Instructions (Signed)
Gabapentin and Benadryl will be at the pharmacy

## 2013-02-12 NOTE — Progress Notes (Signed)
Patient ID: Erik Wright, male   DOB: 05/17/1955, 58 y.o.   MRN: [id number]  Chief Complaint  Patient presents with  . Follow-up    Post op 1 SARK DOS 02/09/13    BP 139/95  Ht 5\' 10"  (1.778 m)  Wt 265 lb (120.203 kg)  BMI 38.02 kg/m2  The patient did well Friday and Saturday then started having increasing pain today Saturday and Saturday afternoon into Sunday and Monday. He complains that he is dizzy when he stands up and that he can't wait on his right leg  He is complaining of posterior thigh pain radiating down into his foot associated with some numbness and tingling.  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  Has a negative Homans sign he has weakness in his foot in terms of dorsiflexion it is painful range of motion suggesting hemarthrosis although there is not a significant amount of knee swelling  Recommend Neurontin to address the numbness and tingling. Come back in one week for suture removal.

## 2013-02-20 ENCOUNTER — Ambulatory Visit: Payer: Federal, State, Local not specified - PPO | Admitting: Orthopedic Surgery

## 2013-02-22 ENCOUNTER — Encounter: Payer: Self-pay | Admitting: Orthopedic Surgery

## 2013-02-22 ENCOUNTER — Ambulatory Visit (INDEPENDENT_AMBULATORY_CARE_PROVIDER_SITE_OTHER): Payer: Federal, State, Local not specified - PPO | Admitting: Orthopedic Surgery

## 2013-02-22 VITALS — BP 133/94 | Ht 70.0 in | Wt 265.0 lb

## 2013-02-22 DIAGNOSIS — M25569 Pain in unspecified knee: Secondary | ICD-10-CM

## 2013-02-22 MED ORDER — OXYCODONE-ACETAMINOPHEN 10-325 MG PO TABS: 1.0000 | ORAL_TABLET | ORAL | Status: DC | PRN

## 2013-02-22 MED ORDER — IBUPROFEN 800 MG PO TABS: 800.0000 mg | ORAL_TABLET | Freq: Three times a day (TID) | ORAL | Status: DC | PRN

## 2013-02-22 NOTE — Progress Notes (Signed)
Patient ID: Erik Wright, male   DOB: 03/27/1955, 58 y.o.   MRN: [id number]  Encounter Diagnosis  Name Primary?  . Knee pain Yes    Chief Complaint  Patient presents with  . Follow-up    Post op 2 SARK DOS 02/09/13    BP 133/94  Ht 5\' 10"  (1.778 m)  Wt 265 lb (120.203 kg)  BMI 38.02 kg/m2  Status post arthroscopy right knee. The patient also has a chronic back condition with radicular symptoms in his right leg which responded nicely to gabapentin. He has swelling over the medial portal secondary to a blood vessel which had to be tied off by hand ties and basically required arthrotomy. The area looks good no signs of infection there is some swelling there. His range of motion is currently 5-90. His posterior leg pain has improved  Recommend continue home exercises with active range of motion, crutch support.  Followup in one week  Meds ordered this encounter  Medications  . oxyCODONE-acetaminophen (PERCOCET) 10-325 MG per tablet    Sig: Take 1 tablet by mouth every 4 (four) hours as needed for pain.    Dispense:  42 tablet    Refill:  0  . ibuprofen (ADVIL,MOTRIN) 800 MG tablet    Sig: Take 1 tablet (800 mg total) by mouth every 8 (eight) hours as needed.    Dispense:  30 tablet    Refill:  0

## 2013-02-22 NOTE — Patient Instructions (Signed)
Continue the knee bends  And the gabapentin  Start ibuprofen at night for aching

## 2013-03-01 ENCOUNTER — Ambulatory Visit: Payer: Federal, State, Local not specified - PPO | Admitting: Orthopedic Surgery

## 2013-03-06 ENCOUNTER — Encounter: Payer: Self-pay | Admitting: Orthopedic Surgery

## 2013-03-06 ENCOUNTER — Ambulatory Visit (INDEPENDENT_AMBULATORY_CARE_PROVIDER_SITE_OTHER): Payer: Federal, State, Local not specified - PPO | Admitting: Orthopedic Surgery

## 2013-03-06 VITALS — BP 137/87 | Ht 70.0 in | Wt 265.0 lb

## 2013-03-06 DIAGNOSIS — M25569 Pain in unspecified knee: Secondary | ICD-10-CM

## 2013-03-06 DIAGNOSIS — IMO0002 Reserved for concepts with insufficient information to code with codable children: Secondary | ICD-10-CM

## 2013-03-06 DIAGNOSIS — M5410 Radiculopathy, site unspecified: Secondary | ICD-10-CM

## 2013-03-06 DIAGNOSIS — Z9889 Other specified postprocedural states: Secondary | ICD-10-CM

## 2013-03-06 MED ORDER — OXYCODONE-ACETAMINOPHEN 5-325 MG PO TABS: 1.0000 | ORAL_TABLET | ORAL | Status: DC | PRN

## 2013-03-06 MED ORDER — IBUPROFEN 800 MG PO TABS: 800.0000 mg | ORAL_TABLET | Freq: Three times a day (TID) | ORAL | Status: DC | PRN

## 2013-03-06 NOTE — Progress Notes (Signed)
Patient ID: Erik Wright, male   DOB: 09/04/1955, 58 y.o.   MRN: [id number] Chief Complaint  Patient presents with  . Follow-up    one weejk recheck SARK DOS 02/09/13    Assess was arthroscopy right knee really not much on arthroscopic evaluation of the knee. Complains of severe leg pain not knee pain is now regained some of his range of motion and 105 of flexion is walking without support with occasional use of a cane  I referred him back to his neurosurgeon as we said we would do if we didn't find much on his knee arthroscopy. Improvements after surgery include his range of motion and less intra-articular symptoms  Followup with me in a month continue current medications change Percocet 5 mg

## 2013-04-05 ENCOUNTER — Ambulatory Visit (INDEPENDENT_AMBULATORY_CARE_PROVIDER_SITE_OTHER): Payer: Federal, State, Local not specified - PPO | Admitting: Orthopedic Surgery

## 2013-04-05 VITALS — BP 128/81 | Ht 70.0 in | Wt 265.0 lb

## 2013-04-05 DIAGNOSIS — Z9889 Other specified postprocedural states: Secondary | ICD-10-CM

## 2013-04-05 MED ORDER — OXYCODONE-ACETAMINOPHEN 5-325 MG PO TABS: 1.0000 | ORAL_TABLET | ORAL | Status: DC | PRN

## 2013-04-05 NOTE — Progress Notes (Signed)
Patient ID: Erik Wright, male   DOB: 02/17/1955, 58 y.o.   MRN: [id number]  Chief Complaint  Patient presents with  . Follow-up    one month recheck right knee s/p SARK DOS 02/09/13   The patient comes in for his followup visit approximately 4 weeks after your surgery. Please see previous notes for details of the surgical findings  He still has posterior leg pain and calf pain. He talked with a neurosurgeon who thought he might of had a compartment syndrome and he has no evidence of compartment syndrome. I tried to explain to him that a compartment syndrome as a surgical emergency and that if he had one he would've had surgery by nail or he would've had an amputation he has neurologic pain in his right leg unrelated to his knee surgery. His knee his x-ray feeling much better. However he still having pain in his leg. His Neurontin was increased by the neurosurgeon. However, he is still having pain in the back of his leg. As discussed with him prior to surgery he may have residual pain in his leg is related to his neurologic condition related to his spine problem  Follow up as needed  Meds ordered this encounter  Medications  . oxyCODONE-acetaminophen (PERCOCET/ROXICET) 5-325 MG per tablet    Sig: Take 1 tablet by mouth every 4 (four) hours as needed for severe pain.    Dispense:  40 tablet    Refill:  0

## 2013-04-26 ENCOUNTER — Ambulatory Visit (HOSPITAL_COMMUNITY)
Admission: RE | Admit: 2013-04-26 | Discharge: 2013-04-26 | Disposition: A | Discharge status: Home / Self Care | Payer: Federal, State, Local not specified - PPO | Source: Ambulatory Visit | Attending: Orthopedic Surgery | Admitting: Orthopedic Surgery

## 2013-04-26 DIAGNOSIS — M62838 Other muscle spasm: Secondary | ICD-10-CM | POA: Insufficient documentation

## 2013-04-26 DIAGNOSIS — R269 Unspecified abnormalities of gait and mobility: Secondary | ICD-10-CM | POA: Insufficient documentation

## 2013-04-26 DIAGNOSIS — M6281 Muscle weakness (generalized): Secondary | ICD-10-CM | POA: Insufficient documentation

## 2013-04-26 DIAGNOSIS — IMO0001 Reserved for inherently not codable concepts without codable children: Secondary | ICD-10-CM | POA: Insufficient documentation

## 2013-04-26 DIAGNOSIS — M25569 Pain in unspecified knee: Secondary | ICD-10-CM | POA: Insufficient documentation

## 2013-04-26 NOTE — Evaluation (Signed)
Physical Therapy Evaluation  Patient Details  Name: Erik Wright MRN: [id number] Date of Birth: 06/10/1955  Today's Date: 04/26/2013 Time: 0800-0845 PT Time Calculation (min): 45 min    Charges: 1 evaluation, therapeutic exercise [id number]          Visit#: 1 of 12  Re-eval: 05/26/13 Assessment Diagnosis: Swelling in bone (tibia/fibula) of Right LE  Next MD Visit: 05/22/13, Dr. Wyline MoodWeiner Prior Therapy: no  Authorization: [employer]    Authorization Time Period:    Authorization Visit#: 1 of 12   Past Medical History:  Past Medical History  Diagnosis Date  . Hypertension   . Anxiety   . Chronic venous insufficiency   . Hypercholesteremia   . History of gout    Past Surgical History:  Past Surgical History  Procedure Laterality Date  . Laparoscopic gastric banding    . Back surgery      lumbar disc  . Knee arthroscopy with medial menisectomy Right 02/09/2013    Procedure: KNEE ARTHROSCOPY WITH MEDIAL MENISECTOMY with limited debridement;  Surgeon: Vickki HearingStanley E Harrison, MD;  Location: [facility];  Service: Orthopedics;  Laterality: Right;    Subjective Symptoms/Limitations Symptoms: no complaint on numbness and tingling. Primary complaint of aching. occasional right knee pain with knee flexion.  Pertinent History: Most recent MRI results recieved on Tuesday 04/24/13 revealing bone swelling for which patient was placed on nonweight bearing restrictions on Rt LE. Knee arthroscopic surgery on 02/09/13, for which this is complication of. 1996 diagnosed with chronic venous insufficiency that had been previosuly controlled wit compression socks though patient no longer wears them due to needing new compression socks/hose.  Limitations: Standing How long can you sit comfortably?: no difficulty How long can you stand comfortably?: Unable to put weight on Rt LE due to pain How long can you walk comfortably?: Unable to walk with weight on Rt LE Repetition: Increases Symptoms Pain Assessment Currently in  Pain?: Yes Pain Score: 4  Pain Location: Leg Pain Orientation: Right Pain Type: Chronic pain Pain Radiating Towards: Back of knee along lateral leg and fibula Pain Onset: More than a month ago Pain Frequency: Intermittent Pain Relieving Factors: non weight bearing Effect of Pain on Daily Activities: Difficulty farming, fencing for farming, difficulty walking, difficulty stanidng  Precautions/Restrictions  Precautions Precaution Comments: Nonweight bearing on Right  Restrictions Weight Bearing Restrictions: Yes RLE Weight Bearing: Non weight bearing Other Position/Activity Restrictions: Patient is to elevate Rt LE whn able to decrease swelling.  Balance Screening Balance Screen Has the patient fallen in the past 6 months: No  Prior Functioning  Prior Function Level of Independence: Independent with basic ADLs  Cognition/Observation Cognition Overall Cognitive Status: Within Functional Limits for tasks assessed  Sensation/Coordination/Flexibility/Functional Tests Sensation Light Touch: Appears Intact Stereognosis: Appears Intact Hot/Cold: Appears Intact Proprioception: Appears Intact Flexibility 90/90: Positive Functional Tests Functional Tests: FOTO to be completed next session (patient was uanble to complete it this session due to time constraints  Assessment RLE AROM (degrees) Right Knee Extension: 0 Right Knee Flexion: 115 Right Ankle Dorsiflexion: -5 Right Ankle Plantar Flexion: 50 RLE Strength Right Hip Flexion: 4/5 Right Hip Extension: 4/5 Right Hip ABduction: 3+/5 Right Knee Flexion: 4/5 Right Knee Extension: 4/5 Right Ankle Dorsiflexion: 3+/5 Right Ankle Plantar Flexion: 3+/5  Exercise/Treatments Mobility/Balance     Stretches   Calf stretch 10x 3sec Knee flexion AAROM  Stretch 10x 3sec Hamstring stretch 10x 3sec Seated  long arc quad 10x 3sec Supine  Straight Leg raise 10x 3sec hold Quad set  10x 3sec  hold Prone   hamstring curl  10x  Physical Therapy Assessment and Plan PT Assessment and Plan Clinical Impression Statement: Patient demosntrated limited Rt knee, hip, and ankle mobility and Rt LE weakness following Arthroscopic surgery in February which has since developed a complication of bone swelling as determiend by MD via MRI (04/24/13). Patient has since been placed on non-weight bearing (NWB) precautions though he states having difficulty maintaining NWB precautiosn due to limited balance. 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 Pt will benefit from skilled therapeutic intervention in order to improve on the following deficits: Abnormal gait;Decreased activity tolerance;Decreased balance;Decreased mobility;Decreased range of motion;Decreased strength;Pain;Difficulty walking;Impaired flexibility;Increased muscle spasms Rehab Potential: Good PT Frequency: Min 3X/week PT Duration: 4 weeks PT Treatment/Interventions: Gait training;Stair training;Functional mobility training;Therapeutic exercise;Therapeutic activities;Manual techniques;Patient/family education PT Plan: utilize NWBing stretches and strengthening exercises to address above listed impairements. Progress exercises as able.  FOTO  Goals Home Exercise Program Pt/caregiver will Perform Home Exercise Program: For increased ROM;For increased strengthening PT Goal: Perform Home Exercise Program - Progress: Goal set today PT Short Term Goals Time to Complete Short Term Goals: 2 weeks PT Short Term Goal 1: Increase Rt ankle dorsiflexion ROM to 15 degrees so that patient will be able to normalize gait when non weightbearing precautions are lifted.  PT Short Term Goal 2: Increase Rt knee flexion to 125 so patient can sit to chair with decreased deficulty  due to knee stiffness  PT Short Term Goal 3: Patient will demsonstrate proper form with crutches during ambulation  with Rt LE non weight bearing to decrease risk of falls durign ambulation.  PT Long Term Goals Time to Complete Long Term Goals: 4 weeks PT Long Term Goal 1: demonstrate Increased quad strength to 4/5 manual muscle test (MMT) so patient may perform sit to stand without UE support when weight bearing precautions are lifted.  PT Long Term Goal 2: Increase hip abduction strength to 4+/5 with MMT to improve ability to stand on one leg when weight bearign precautions are removed.  Long Term Goal 3: Demonstrate Increased quad strength to 5/5 manual muscle test so patient may ambulate down stairs with reciprocal gait without UE support when weight bearing precautions are lifted.   Problem List Patient Active Problem List   Diagnosis Date Noted  . Derangement of posterior horn of medial meniscus 02/01/2013  . Back pain without radiation 12/20/2012  . Osteoarthritis of right knee 12/20/2012  . Left ankle pain 12/20/2012  . Medial meniscus, posterior horn derangement 12/20/2012  . Knee pain 12/20/2012  . Fractured great toe 06/26/2012  . Constipation 06/26/2012    PT - End of Session Activity Tolerance: Patient tolerated treatment well General Behavior During Therapy: WFL for tasks assessed/performed PT Plan of Care PT Home Exercise Plan: knee flexion AAROM, Quad Set, Straight leg raise, prone hamstring strengthening, calf stretch, hamstring stretch  PT Patient Instructions: perform twice daily Consulted and Agree with Plan of Care: Patient  Doyne KeelCash R Kc Sedlak 04/26/2013, 12:06 PM  Physician Documentation Your signature is required to indicate approval of the treatment plan as stated above.  Please sign and either send electronically or make a copy of this report for your files and return this physician signed original.   Please mark one 1.__approve of plan  2. ___approve of plan with the following conditions.   ______________________________  _____________________ Physician Signature                                                                                                             Date

## 2013-04-30 ENCOUNTER — Ambulatory Visit (HOSPITAL_COMMUNITY)
Admission: RE | Admit: 2013-04-30 | Discharge: 2013-04-30 | Disposition: A | Discharge status: Home / Self Care | Payer: Federal, State, Local not specified - PPO | Source: Ambulatory Visit | Attending: Orthopedic Surgery | Admitting: Orthopedic Surgery

## 2013-04-30 NOTE — Progress Notes (Signed)
Physical Therapy Treatment Patient Details  Name: Erik Wright MRN: [id number] Date of Birth: 12/14/1955  Today's Date: 04/30/2013 Time: 0800-0843 PT Time Calculation (min): 43 min Charge: TE [id number]  Visit#: 2 of 12  Re-eval: 05/26/13    Authorization: [employer]  Authorization Time Period:    Authorization Visit#: 2 of 12   Subjective: Symptoms/Limitations Symptoms: Pt compliant with HEP, no questions about any exercise.  Reported relief by staying off LE this weekend.  Current pain scale 2-3/10 Rt LE. Pain Assessment Currently in Pain?: Yes Pain Score: 3  Pain Location: Leg Pain Orientation: Right  Precautions/Restrictions  Precautions Precaution Comments: Nonweight bearing on Right  Restrictions Weight Bearing Restrictions: Yes RLE Weight Bearing: Non weight bearing Other Position/Activity Restrictions: Patient is to elevate Rt LE whn able to decrease swelling.  Objective: FOTO 34% status, 66% limitation  Exercise/Treatments Stretches Active Hamstring Stretch: 3 reps;20 seconds Gastroc Stretch: 3 reps;20 seconds;Limitations Gastroc Stretch Limitations: with rope NWB Standing Gait Training: Gait training with crutches NWB Rt LE, cueing for proper positioning, NWB precautions and sequencing with crutches Seated Long Arc Quad: Right;15 reps Supine Quad Sets: Right;10 reps Short Arc Quad Sets: Right;10 reps Heel Slides: Right;10 reps Terminal Knee Extension: Right Hip Adduction Isometric: 10 reps Straight Leg Raises: 10 reps Sidelying Hip ABduction: 15 reps Prone  Hamstring Curl: 10 reps Hip Extension: 10 reps      Physical Therapy Assessment and Plan PT Assessment and Plan Clinical Impression Statement: Began NWBing mat exercises for strengthening, improve AROM and stretches to improve flexibility; pt able to demonstrate appropriate technique with all exercises following vc-ing and demonstration.  Pt ambulating with TTWB with crutches due to limitied  balance, pt educated on NWB precautions. PT Plan: utilize NWBing stretches and strengthening exercises to address above listed impairements. Progress exercises as able.    Goals PT Short Term Goals Time to Complete Short Term Goals: 2 weeks PT Short Term Goal 1: Increase Rt ankle dorsiflexion ROM to 15 degrees so that patient will be able to normalize gait when non weightbearing precautions are lifted.  PT Short Term Goal 1 - Progress: Progressing toward goal PT Short Term Goal 2: Increase Rt knee flexion to 125 so patient can sit to chair with decreased deficulty  due to knee stiffness  PT Short Term Goal 2 - Progress: Progressing toward goal PT Short Term Goal 3: Patient will demsonstrate proper form with crutches during ambulation with Rt LE non weight bearing to decrease risk of falls durign ambulation.  PT Short Term Goal 3 - Progress: Progressing toward goal PT Long Term Goals Time to Complete Long Term Goals: 4 weeks PT Long Term Goal 1: demonstrate Increased quad strength to 4/5 manual muscle test (MMT) so patient may perform sit to stand without UE support when weight bearing precautions are lifted.  PT Long Term Goal 1 - Progress: Progressing toward goal PT Long Term Goal 2: Increase hip abduction strength to 4+/5 with MMT to improve ability to stand on one leg when weight bearign precautions are removed.  Long Term Goal 3: Demonstrate Increased quad strength to 5/5 manual muscle test so patient may ambulate down stairs with reciprocal gait without UE support when weight bearing precautions are lifted.   Problem List Patient Active Problem List   Diagnosis Date Noted  . Derangement of posterior horn of medial meniscus 02/01/2013  . Back pain without radiation 12/20/2012  . Osteoarthritis of right knee 12/20/2012  . Left ankle pain 12/20/2012  . Medial  meniscus, posterior horn derangement 12/20/2012  . Knee pain 12/20/2012  . Fractured great toe 06/26/2012  . Constipation  06/26/2012    PT - End of Session Activity Tolerance: Patient tolerated treatment well General Behavior During Therapy: [facility] for tasks assessed/performed  GP    Juel Burrowasey Jo Talonda Artist 04/30/2013, 12:06 PM

## 2013-05-02 ENCOUNTER — Ambulatory Visit (HOSPITAL_COMMUNITY): Payer: Federal, State, Local not specified - PPO

## 2013-05-03 ENCOUNTER — Ambulatory Visit (HOSPITAL_COMMUNITY): Payer: Federal, State, Local not specified - PPO

## 2013-05-06 ENCOUNTER — Encounter (HOSPITAL_COMMUNITY): Payer: Self-pay | Admitting: Emergency Medicine

## 2013-05-06 ENCOUNTER — Emergency Department (HOSPITAL_COMMUNITY)
Admission: EM | Admit: 2013-05-06 | Discharge: 2013-05-06 | Disposition: A | Discharge status: Home / Self Care | Payer: Federal, State, Local not specified - PPO | Attending: Emergency Medicine | Admitting: Emergency Medicine

## 2013-05-06 DIAGNOSIS — Z87891 Personal history of nicotine dependence: Secondary | ICD-10-CM | POA: Insufficient documentation

## 2013-05-06 DIAGNOSIS — F411 Generalized anxiety disorder: Secondary | ICD-10-CM | POA: Insufficient documentation

## 2013-05-06 DIAGNOSIS — H60399 Other infective otitis externa, unspecified ear: Secondary | ICD-10-CM | POA: Insufficient documentation

## 2013-05-06 DIAGNOSIS — Z8679 Personal history of other diseases of the circulatory system: Secondary | ICD-10-CM | POA: Insufficient documentation

## 2013-05-06 DIAGNOSIS — H6092 Unspecified otitis externa, left ear: Secondary | ICD-10-CM

## 2013-05-06 DIAGNOSIS — I10 Essential (primary) hypertension: Secondary | ICD-10-CM | POA: Insufficient documentation

## 2013-05-06 DIAGNOSIS — E7800 Pure hypercholesterolemia, unspecified: Secondary | ICD-10-CM | POA: Insufficient documentation

## 2013-05-06 MED ORDER — SULFAMETHOXAZOLE-TRIMETHOPRIM 800-160 MG PO TABS: 1.0000 | ORAL_TABLET | Freq: Two times a day (BID) | ORAL | Status: DC

## 2013-05-06 MED ORDER — HYDROMORPHONE HCL PF 2 MG/ML IJ SOLN
2.0000 mg | Freq: Once | INTRAMUSCULAR | Status: AC
  Administered 2013-05-06: 2 mg via INTRAMUSCULAR
  Filled 2013-05-06: qty 1

## 2013-05-06 MED ORDER — OXYCODONE-ACETAMINOPHEN 5-325 MG PO TABS: 2.0000 | ORAL_TABLET | ORAL | Status: DC | PRN

## 2013-05-06 MED ORDER — NEOMYCIN-POLYMYXIN-HC 3.5-10000-1 OT SUSP
4.0000 [drp] | Freq: Four times a day (QID) | OTIC | Status: DC
  Administered 2013-05-06: 4 [drp] via OTIC
  Filled 2013-05-06 (×2): qty 10

## 2013-05-06 MED ORDER — SULFAMETHOXAZOLE-TMP DS 800-160 MG PO TABS
1.0000 | ORAL_TABLET | Freq: Once | ORAL | Status: AC
  Administered 2013-05-06: 1 via ORAL
  Filled 2013-05-06: qty 1

## 2013-05-06 NOTE — Discharge Instructions (Signed)
Cortisporin 4 drops 4 times daily left ear x 7 days.  RETURN IMMEDIATELY IF you develop a sudden, severe headache or confusion, become poorly responsive or faint, develop a fever above 100.49F or problem breathing, have a change in speech, vision, swallowing, or understanding, or develop new weakness, numbness, tingling, incoordination, vertigo (sensation of motion), or have a seizure.

## 2013-05-06 NOTE — ED Notes (Signed)
Patient complaining of severe left side earache, dizziness, sore throat, trouble swallowing, and headache x 2 days.

## 2013-05-06 NOTE — ED Provider Notes (Signed)
CSN: [id number]     Arrival date & time 05/06/13  1815 History   First MD Initiated Contact with Patient 05/06/13 2022     Chief Complaint  Patient presents with  . Otalgia  . Facial Pain     (Consider location/radiation/quality/duration/timing/severity/associated sxs/prior Treatment) HPI [age] year old male with about a week of some mild nasal congestion but now 2 days gradual onset severe left ear pain with slight decreased hearing pain worse with chewing and palpation and pulling on his left ear with purulent drainage from his left ear with severe localized pain without radiation or associated symptoms with no headache no fever no confusion no vertigo no change in speech vision swallowing or understanding no focal or lateralizing weakness numbness or incoordination no difficulty walking no cough chest pain shortness breath abdominal pain or vomiting. There is no treatment prior to arrival. Past Medical History  Diagnosis Date  . Hypertension   . Anxiety   . Chronic venous insufficiency   . Hypercholesteremia   . History of gout    Past Surgical History  Procedure Laterality Date  . Laparoscopic gastric banding    . Back surgery      lumbar disc  . Knee arthroscopy with medial menisectomy Right 02/09/2013    Procedure: KNEE ARTHROSCOPY WITH MEDIAL MENISECTOMY with limited debridement;  Surgeon: Vickki HearingStanley E Harrison, MD;  Location: [facility];  Service: Orthopedics;  Laterality: Right;   History reviewed. No pertinent family history. History  Substance Use Topics  . Smoking status: Former Smoker -- 1.00 packs/day for 35 years    Types: Cigarettes    Quit date: 12/24/2012  . Smokeless tobacco: Not on file  . Alcohol Use: Yes     Comment: Occ    Review of Systems  10 Systems reviewed and are negative for acute change except as noted in the HPI.  Allergies  Review of patient's allergies indicates no known allergies.  Home Medications   Prior to Admission medications   Medication  Sig Start Date End Date Taking? Authorizing Provider  chlorthalidone (HYGROTON) 25 MG tablet Take 12.5 mg by mouth every morning.    Yes Historical Provider, MD  diazepam (VALIUM) 5 MG tablet Take 5 mg by mouth at bedtime as needed for sedation.    Yes Historical Provider, MD  hydrOXYzine (VISTARIL) 25 MG capsule Take 25 mg by mouth 3 (three) times daily as needed. For itching 05/01/13  Yes Historical Provider, MD  oxyCODONE-acetaminophen (PERCOCET/ROXICET) 5-325 MG per tablet Take 1 tablet by mouth every 4 (four) hours as needed for severe pain. 04/05/13  Yes Vickki HearingStanley E Harrison, MD  oxymetazoline (AFRIN) 0.05 % nasal spray Place 1 spray into both nostrils 2 (two) times daily as needed for congestion.   Yes Historical Provider, MD  PARoxetine (PAXIL-CR) 25 MG 24 hr tablet Take 25 mg by mouth every morning.    Yes Historical Provider, MD  simvastatin (ZOCOR) 20 MG tablet Take 20 mg by mouth daily.   Yes Historical Provider, MD   BP 149/91  Pulse 93  Temp(Src) 98.1 F (36.7 C) (Oral)  Resp 26  Ht 5\' 11"  (1.803 m)  Wt 265 lb (120.203 kg)  BMI 36.98 kg/m2  SpO2 97% Physical Exam  Nursing note and vitals reviewed. Constitutional:  Awake, alert, nontoxic appearance with baseline speech for patient.  HENT:  Head: Atraumatic.  Mouth/Throat: Oropharynx is clear and moist. No oropharyngeal exudate.  Right tympanic membranes clear left ear tender to traction with left external auditory canal swollen  shut with purulent drainage unable to visualize left membrane left mastoid region nontender  Eyes: EOM are normal. Pupils are equal, round, and reactive to light. Right eye exhibits no discharge. Left eye exhibits no discharge.  No nystagmus; negative test of skew  Neck: Neck supple.  Cardiovascular: Normal rate and regular rhythm.   No murmur heard. Pulmonary/Chest: Effort normal and breath sounds normal. No stridor. No respiratory distress. He has no wheezes. He has no rales. He exhibits no tenderness.   Abdominal: Soft. Bowel sounds are normal. He exhibits no mass. There is no tenderness. There is no rebound.  Musculoskeletal: He exhibits no tenderness.  Baseline ROM, moves extremities with no obvious new focal weakness.  Lymphadenopathy:    He has no cervical adenopathy.  Neurological: He is alert.  Awake, alert, cooperative and aware of situation; motor strength 5/5 bilaterally; sensation normal to light touch bilaterally; peripheral visual fields full to confrontation; no facial asymmetry; tongue midline; major cranial nerves appear intact; no pronator drift, normal finger to nose bilaterally, baseline gait without new ataxia.  Skin: No rash noted.  Psychiatric: He has a normal mood and affect.    ED Course  Procedures (including critical care time) Patient / Family / Caregiver informed of clinical course, understand medical decision-making process, and agree with plan. Labs Review Labs Reviewed - No data to display  Imaging Review No results found.   EKG Interpretation None      MDM   Final diagnoses:  Otitis externa, left    I doubt any other EMC precluding discharge at this time including, but not necessarily limited to the following: Mastoiditis, malignant otitis externa.    Erik HornJohn M Carmeline Kowal, MD 05/09/13 2325

## 2013-05-07 ENCOUNTER — Ambulatory Visit (HOSPITAL_COMMUNITY): Payer: Federal, State, Local not specified - PPO | Admitting: Physical Therapy

## 2013-05-07 MED FILL — Oxycodone w/ Acetaminophen Tab 5-325 MG: ORAL | Qty: 6 | Status: AC

## 2013-05-09 ENCOUNTER — Inpatient Hospital Stay (HOSPITAL_COMMUNITY)
Admission: RE | Admit: 2013-05-09 | Payer: Federal, State, Local not specified - PPO | Source: Ambulatory Visit | Admitting: Physical Therapy

## 2013-05-10 ENCOUNTER — Ambulatory Visit (HOSPITAL_COMMUNITY): Payer: Federal, State, Local not specified - PPO | Admitting: Physical Therapy

## 2013-05-14 ENCOUNTER — Ambulatory Visit (HOSPITAL_COMMUNITY): Payer: Federal, State, Local not specified - PPO

## 2013-05-16 ENCOUNTER — Ambulatory Visit (HOSPITAL_COMMUNITY): Payer: Federal, State, Local not specified - PPO | Admitting: *Deleted

## 2013-05-18 ENCOUNTER — Ambulatory Visit (HOSPITAL_COMMUNITY): Payer: Federal, State, Local not specified - PPO | Admitting: Physical Therapy

## 2013-05-21 ENCOUNTER — Ambulatory Visit (HOSPITAL_COMMUNITY): Payer: Federal, State, Local not specified - PPO | Admitting: Physical Therapy

## 2013-05-23 ENCOUNTER — Ambulatory Visit (HOSPITAL_COMMUNITY): Payer: Federal, State, Local not specified - PPO | Admitting: Physical Therapy

## 2013-05-25 ENCOUNTER — Ambulatory Visit (HOSPITAL_COMMUNITY): Payer: Federal, State, Local not specified - PPO | Admitting: Physical Therapy

## 2013-06-07 NOTE — Addendum Note (Signed)
Encounter addended by: Doyne Keel, PT on: 06/07/2013 10:01 AM<BR>     Documentation filed: Episodes, Letters, Notes Section

## 2013-06-07 NOTE — Progress Notes (Signed)
Dischage Patient Details  Name: DEKARI KOETTING MRN: [id number] Date of Birth: 1955/05/29  Today's Date: 06/07/2013  Patient discharges due to not returning to therapy. Patient not returning phone calls.   Jalina Blowers R Lauraann Missey 06/07/2013, 9:59 AM

## 2013-09-19 ENCOUNTER — Other Ambulatory Visit: Payer: Self-pay | Admitting: *Deleted

## 2013-09-19 MED ORDER — IBUPROFEN 800 MG PO TABS: 800.0000 mg | ORAL_TABLET | Freq: Three times a day (TID) | ORAL | Status: DC | PRN

## 2015-01-07 ENCOUNTER — Other Ambulatory Visit (HOSPITAL_COMMUNITY): Payer: Self-pay | Admitting: Internal Medicine

## 2015-01-07 DIAGNOSIS — R3129 Other microscopic hematuria: Secondary | ICD-10-CM

## 2015-01-08 ENCOUNTER — Ambulatory Visit (HOSPITAL_COMMUNITY)
Admission: RE | Admit: 2015-01-08 | Discharge: 2015-01-08 | Disposition: A | Discharge status: Home / Self Care | Payer: Federal, State, Local not specified - PPO | Source: Ambulatory Visit | Attending: Internal Medicine | Admitting: Internal Medicine

## 2015-01-08 DIAGNOSIS — N281 Cyst of kidney, acquired: Secondary | ICD-10-CM | POA: Insufficient documentation

## 2015-01-08 DIAGNOSIS — R3129 Other microscopic hematuria: Secondary | ICD-10-CM | POA: Diagnosis not present

## 2015-05-15 DIAGNOSIS — T79A0XA Compartment syndrome, unspecified, initial encounter: Secondary | ICD-10-CM | POA: Diagnosis not present

## 2015-05-15 DIAGNOSIS — M47816 Spondylosis without myelopathy or radiculopathy, lumbar region: Secondary | ICD-10-CM | POA: Diagnosis not present

## 2015-05-15 DIAGNOSIS — M259 Joint disorder, unspecified: Secondary | ICD-10-CM | POA: Diagnosis not present

## 2015-08-19 DIAGNOSIS — M47816 Spondylosis without myelopathy or radiculopathy, lumbar region: Secondary | ICD-10-CM | POA: Diagnosis not present

## 2015-09-15 DIAGNOSIS — M109 Gout, unspecified: Secondary | ICD-10-CM | POA: Diagnosis not present

## 2015-09-15 DIAGNOSIS — Z79899 Other long term (current) drug therapy: Secondary | ICD-10-CM | POA: Diagnosis not present

## 2015-09-15 DIAGNOSIS — F419 Anxiety disorder, unspecified: Secondary | ICD-10-CM | POA: Diagnosis not present

## 2015-09-15 DIAGNOSIS — Z1159 Encounter for screening for other viral diseases: Secondary | ICD-10-CM | POA: Diagnosis not present

## 2015-09-15 DIAGNOSIS — Z125 Encounter for screening for malignant neoplasm of prostate: Secondary | ICD-10-CM | POA: Diagnosis not present

## 2015-09-15 DIAGNOSIS — E785 Hyperlipidemia, unspecified: Secondary | ICD-10-CM | POA: Diagnosis not present

## 2015-09-15 DIAGNOSIS — I10 Essential (primary) hypertension: Secondary | ICD-10-CM | POA: Diagnosis not present

## 2015-09-15 DIAGNOSIS — K219 Gastro-esophageal reflux disease without esophagitis: Secondary | ICD-10-CM | POA: Diagnosis not present

## 2015-09-22 DIAGNOSIS — Z6839 Body mass index (BMI) 39.0-39.9, adult: Secondary | ICD-10-CM | POA: Diagnosis not present

## 2015-09-22 DIAGNOSIS — I10 Essential (primary) hypertension: Secondary | ICD-10-CM | POA: Diagnosis not present

## 2015-09-22 DIAGNOSIS — Z0001 Encounter for general adult medical examination with abnormal findings: Secondary | ICD-10-CM | POA: Diagnosis not present

## 2015-09-22 DIAGNOSIS — I4510 Unspecified right bundle-branch block: Secondary | ICD-10-CM | POA: Diagnosis not present

## 2015-09-22 DIAGNOSIS — Z23 Encounter for immunization: Secondary | ICD-10-CM | POA: Diagnosis not present

## 2015-10-20 DIAGNOSIS — L239 Allergic contact dermatitis, unspecified cause: Secondary | ICD-10-CM | POA: Diagnosis not present

## 2015-12-02 DIAGNOSIS — M259 Joint disorder, unspecified: Secondary | ICD-10-CM | POA: Diagnosis not present

## 2015-12-02 DIAGNOSIS — T79A0XA Compartment syndrome, unspecified, initial encounter: Secondary | ICD-10-CM | POA: Diagnosis not present

## 2015-12-02 DIAGNOSIS — M47816 Spondylosis without myelopathy or radiculopathy, lumbar region: Secondary | ICD-10-CM | POA: Diagnosis not present

## 2015-12-16 ENCOUNTER — Telehealth: Payer: Self-pay | Admitting: Internal Medicine

## 2015-12-16 NOTE — Telephone Encounter (Signed)
RECALL FOR TCS °

## 2015-12-16 NOTE — Telephone Encounter (Signed)
Letter mailed to pt.  

## 2016-03-01 DIAGNOSIS — M47816 Spondylosis without myelopathy or radiculopathy, lumbar region: Secondary | ICD-10-CM | POA: Diagnosis not present

## 2016-03-17 DIAGNOSIS — F339 Major depressive disorder, recurrent, unspecified: Secondary | ICD-10-CM | POA: Diagnosis not present

## 2016-03-17 DIAGNOSIS — I10 Essential (primary) hypertension: Secondary | ICD-10-CM | POA: Diagnosis not present

## 2016-03-23 DIAGNOSIS — R1310 Dysphagia, unspecified: Secondary | ICD-10-CM | POA: Diagnosis not present

## 2016-03-23 DIAGNOSIS — Z4651 Encounter for fitting and adjustment of gastric lap band: Secondary | ICD-10-CM | POA: Diagnosis not present

## 2016-03-23 DIAGNOSIS — Z9884 Bariatric surgery status: Secondary | ICD-10-CM | POA: Diagnosis not present

## 2016-05-21 DIAGNOSIS — H9202 Otalgia, left ear: Secondary | ICD-10-CM | POA: Diagnosis not present

## 2016-06-01 DIAGNOSIS — M47816 Spondylosis without myelopathy or radiculopathy, lumbar region: Secondary | ICD-10-CM | POA: Diagnosis not present

## 2016-09-07 DIAGNOSIS — Z6839 Body mass index (BMI) 39.0-39.9, adult: Secondary | ICD-10-CM | POA: Diagnosis not present

## 2016-09-07 DIAGNOSIS — M47816 Spondylosis without myelopathy or radiculopathy, lumbar region: Secondary | ICD-10-CM | POA: Diagnosis not present

## 2016-10-01 DIAGNOSIS — Z79899 Other long term (current) drug therapy: Secondary | ICD-10-CM | POA: Diagnosis not present

## 2016-10-01 DIAGNOSIS — E785 Hyperlipidemia, unspecified: Secondary | ICD-10-CM | POA: Diagnosis not present

## 2016-10-01 DIAGNOSIS — R7303 Prediabetes: Secondary | ICD-10-CM | POA: Diagnosis not present

## 2016-10-01 DIAGNOSIS — I10 Essential (primary) hypertension: Secondary | ICD-10-CM | POA: Diagnosis not present

## 2016-10-01 DIAGNOSIS — Z125 Encounter for screening for malignant neoplasm of prostate: Secondary | ICD-10-CM | POA: Diagnosis not present

## 2016-10-04 DIAGNOSIS — Z0001 Encounter for general adult medical examination with abnormal findings: Secondary | ICD-10-CM | POA: Diagnosis not present

## 2016-10-04 DIAGNOSIS — Z23 Encounter for immunization: Secondary | ICD-10-CM | POA: Diagnosis not present

## 2016-10-04 DIAGNOSIS — Z6841 Body Mass Index (BMI) 40.0 and over, adult: Secondary | ICD-10-CM | POA: Diagnosis not present

## 2016-10-04 DIAGNOSIS — I10 Essential (primary) hypertension: Secondary | ICD-10-CM | POA: Diagnosis not present

## 2016-10-05 DIAGNOSIS — R635 Abnormal weight gain: Secondary | ICD-10-CM | POA: Diagnosis not present

## 2016-10-05 DIAGNOSIS — Z9884 Bariatric surgery status: Secondary | ICD-10-CM | POA: Diagnosis not present

## 2016-10-05 DIAGNOSIS — Z4651 Encounter for fitting and adjustment of gastric lap band: Secondary | ICD-10-CM | POA: Diagnosis not present

## 2016-12-16 DIAGNOSIS — M47816 Spondylosis without myelopathy or radiculopathy, lumbar region: Secondary | ICD-10-CM | POA: Diagnosis not present

## 2016-12-16 DIAGNOSIS — Z6839 Body mass index (BMI) 39.0-39.9, adult: Secondary | ICD-10-CM | POA: Diagnosis not present

## 2016-12-23 ENCOUNTER — Telehealth: Payer: Self-pay | Admitting: Gastroenterology

## 2016-12-23 ENCOUNTER — Ambulatory Visit: Payer: Federal, State, Local not specified - PPO | Admitting: Gastroenterology

## 2016-12-23 ENCOUNTER — Encounter: Payer: Self-pay | Admitting: Gastroenterology

## 2016-12-23 NOTE — Telephone Encounter (Signed)
Patient was a no show and letter sent  °

## 2017-03-15 DIAGNOSIS — M79604 Pain in right leg: Secondary | ICD-10-CM | POA: Diagnosis not present

## 2017-03-15 DIAGNOSIS — Z6838 Body mass index (BMI) 38.0-38.9, adult: Secondary | ICD-10-CM | POA: Diagnosis not present

## 2017-03-15 DIAGNOSIS — M79605 Pain in left leg: Secondary | ICD-10-CM | POA: Diagnosis not present

## 2017-03-15 DIAGNOSIS — I83893 Varicose veins of bilateral lower extremities with other complications: Secondary | ICD-10-CM | POA: Diagnosis not present

## 2017-03-22 DIAGNOSIS — Z6837 Body mass index (BMI) 37.0-37.9, adult: Secondary | ICD-10-CM | POA: Diagnosis not present

## 2017-03-22 DIAGNOSIS — M47816 Spondylosis without myelopathy or radiculopathy, lumbar region: Secondary | ICD-10-CM | POA: Diagnosis not present

## 2017-04-26 DIAGNOSIS — I83892 Varicose veins of left lower extremities with other complications: Secondary | ICD-10-CM | POA: Diagnosis not present

## 2017-07-05 DIAGNOSIS — Z6836 Body mass index (BMI) 36.0-36.9, adult: Secondary | ICD-10-CM | POA: Diagnosis not present

## 2017-07-05 DIAGNOSIS — G894 Chronic pain syndrome: Secondary | ICD-10-CM | POA: Diagnosis not present

## 2017-07-05 DIAGNOSIS — M47816 Spondylosis without myelopathy or radiculopathy, lumbar region: Secondary | ICD-10-CM | POA: Diagnosis not present

## 2017-10-18 DIAGNOSIS — Z6835 Body mass index (BMI) 35.0-35.9, adult: Secondary | ICD-10-CM | POA: Diagnosis not present

## 2017-10-18 DIAGNOSIS — M47816 Spondylosis without myelopathy or radiculopathy, lumbar region: Secondary | ICD-10-CM | POA: Diagnosis not present

## 2017-10-18 DIAGNOSIS — G894 Chronic pain syndrome: Secondary | ICD-10-CM | POA: Diagnosis not present

## 2018-01-09 DIAGNOSIS — Z7251 High risk heterosexual behavior: Secondary | ICD-10-CM | POA: Diagnosis not present

## 2018-01-12 DIAGNOSIS — Z0001 Encounter for general adult medical examination with abnormal findings: Secondary | ICD-10-CM | POA: Diagnosis not present

## 2018-01-12 DIAGNOSIS — Z6836 Body mass index (BMI) 36.0-36.9, adult: Secondary | ICD-10-CM | POA: Diagnosis not present

## 2018-01-12 DIAGNOSIS — Z23 Encounter for immunization: Secondary | ICD-10-CM | POA: Diagnosis not present

## 2018-01-12 DIAGNOSIS — I10 Essential (primary) hypertension: Secondary | ICD-10-CM | POA: Diagnosis not present

## 2018-01-27 DIAGNOSIS — Z6836 Body mass index (BMI) 36.0-36.9, adult: Secondary | ICD-10-CM | POA: Diagnosis not present

## 2018-01-27 DIAGNOSIS — M47816 Spondylosis without myelopathy or radiculopathy, lumbar region: Secondary | ICD-10-CM | POA: Diagnosis not present

## 2018-01-27 DIAGNOSIS — G894 Chronic pain syndrome: Secondary | ICD-10-CM | POA: Diagnosis not present

## 2018-02-15 DIAGNOSIS — Z1211 Encounter for screening for malignant neoplasm of colon: Secondary | ICD-10-CM | POA: Diagnosis not present

## 2018-03-09 DIAGNOSIS — Z1211 Encounter for screening for malignant neoplasm of colon: Secondary | ICD-10-CM | POA: Diagnosis not present

## 2018-04-03 DIAGNOSIS — J309 Allergic rhinitis, unspecified: Secondary | ICD-10-CM | POA: Diagnosis not present

## 2018-04-03 DIAGNOSIS — R51 Headache: Secondary | ICD-10-CM | POA: Diagnosis not present

## 2018-07-13 DIAGNOSIS — F320 Major depressive disorder, single episode, mild: Secondary | ICD-10-CM | POA: Diagnosis not present

## 2018-08-30 ENCOUNTER — Ambulatory Visit (INDEPENDENT_AMBULATORY_CARE_PROVIDER_SITE_OTHER): Payer: Federal, State, Local not specified - PPO | Admitting: Urology

## 2018-08-30 ENCOUNTER — Ambulatory Visit: Payer: Federal, State, Local not specified - PPO | Admitting: Urology

## 2018-08-30 DIAGNOSIS — N430 Encysted hydrocele: Secondary | ICD-10-CM | POA: Diagnosis not present

## 2018-09-05 ENCOUNTER — Other Ambulatory Visit (HOSPITAL_COMMUNITY): Payer: Self-pay | Admitting: Urology

## 2018-09-05 DIAGNOSIS — N433 Hydrocele, unspecified: Secondary | ICD-10-CM

## 2018-09-14 ENCOUNTER — Ambulatory Visit (HOSPITAL_COMMUNITY)
Admission: RE | Admit: 2018-09-14 | Discharge: 2018-09-14 | Disposition: A | Discharge status: Home / Self Care | Payer: Federal, State, Local not specified - PPO | Source: Ambulatory Visit | Attending: Urology | Admitting: Urology

## 2018-09-14 ENCOUNTER — Other Ambulatory Visit: Payer: Self-pay

## 2018-09-14 DIAGNOSIS — N433 Hydrocele, unspecified: Secondary | ICD-10-CM

## 2018-10-04 ENCOUNTER — Ambulatory Visit (INDEPENDENT_AMBULATORY_CARE_PROVIDER_SITE_OTHER): Payer: Federal, State, Local not specified - PPO | Admitting: Urology

## 2018-10-04 DIAGNOSIS — N430 Encysted hydrocele: Secondary | ICD-10-CM | POA: Diagnosis not present

## 2019-01-08 DIAGNOSIS — F329 Major depressive disorder, single episode, unspecified: Secondary | ICD-10-CM | POA: Diagnosis not present

## 2019-01-08 DIAGNOSIS — I10 Essential (primary) hypertension: Secondary | ICD-10-CM | POA: Diagnosis not present

## 2019-01-08 DIAGNOSIS — R7303 Prediabetes: Secondary | ICD-10-CM | POA: Diagnosis not present

## 2019-01-08 DIAGNOSIS — E785 Hyperlipidemia, unspecified: Secondary | ICD-10-CM | POA: Diagnosis not present

## 2019-01-08 DIAGNOSIS — Z79899 Other long term (current) drug therapy: Secondary | ICD-10-CM | POA: Diagnosis not present

## 2019-01-08 DIAGNOSIS — F419 Anxiety disorder, unspecified: Secondary | ICD-10-CM | POA: Diagnosis not present

## 2019-01-23 DIAGNOSIS — F339 Major depressive disorder, recurrent, unspecified: Secondary | ICD-10-CM | POA: Diagnosis not present

## 2019-01-23 DIAGNOSIS — M4306 Spondylolysis, lumbar region: Secondary | ICD-10-CM | POA: Diagnosis not present

## 2019-01-23 DIAGNOSIS — E785 Hyperlipidemia, unspecified: Secondary | ICD-10-CM | POA: Diagnosis not present

## 2019-01-23 DIAGNOSIS — I10 Essential (primary) hypertension: Secondary | ICD-10-CM | POA: Diagnosis not present

## 2019-05-01 DIAGNOSIS — Z6837 Body mass index (BMI) 37.0-37.9, adult: Secondary | ICD-10-CM | POA: Diagnosis not present

## 2019-05-01 DIAGNOSIS — M4306 Spondylolysis, lumbar region: Secondary | ICD-10-CM | POA: Diagnosis not present

## 2019-05-24 ENCOUNTER — Ambulatory Visit: Payer: Federal, State, Local not specified - PPO

## 2019-05-24 ENCOUNTER — Encounter: Payer: Self-pay | Admitting: Orthopaedic Surgery

## 2019-05-24 ENCOUNTER — Ambulatory Visit: Payer: Federal, State, Local not specified - PPO | Admitting: Orthopaedic Surgery

## 2019-05-24 ENCOUNTER — Other Ambulatory Visit: Payer: Self-pay

## 2019-05-24 VITALS — BP 130/105 | HR 80 | Ht 70.0 in | Wt 259.4 lb

## 2019-05-24 DIAGNOSIS — M25561 Pain in right knee: Secondary | ICD-10-CM

## 2019-05-24 DIAGNOSIS — G8929 Other chronic pain: Secondary | ICD-10-CM

## 2019-05-24 MED ORDER — NAPROXEN 500 MG PO TABS: 500.0000 mg | ORAL_TABLET | Freq: Two times a day (BID) | ORAL | 5 refills | Status: DC

## 2019-05-24 NOTE — Progress Notes (Signed)
Subjective:    Patient ID: Erik Wright, male    DOB: July 24, 1955, 64 y.o.   MRN: [id number]  HPI He has had pain in the right knee for some time getting worse progressively.  He has no trauma.  He has swelling, and popping and an intense pain when he rotates to the right.  He has feeling it may give way but it has not.  He has no relief with ice, heat, Advil or rubs.  It is worse over the last month.  He has no redness, no numbness.  He has no other joint pains.   Review of Systems  Constitutional: Positive for activity change.  Musculoskeletal: Positive for arthralgias, gait problem and joint swelling.  All other systems reviewed and are negative.  For Review of Systems, all other systems reviewed and are negative.  The following is a summary of the past history medically, past history surgically, known current medicines, social history and family history.  This information is gathered electronically by the computer from prior information and documentation.  I review this each visit and have found including this information at this point in the chart is beneficial and informative.   Past Medical History:  Diagnosis Date  . Anxiety   . Chronic venous insufficiency   . History of gout   . Hypercholesteremia   . Hypertension     Past Surgical History:  Procedure Laterality Date  . BACK SURGERY     lumbar disc  . KNEE ARTHROSCOPY WITH MEDIAL MENISECTOMY Right 02/09/2013   Procedure: KNEE ARTHROSCOPY WITH MEDIAL MENISECTOMY with limited debridement;  Surgeon: Carole Civil, MD;  Location: [facility];  Service: Orthopedics;  Laterality: Right;  . LAPAROSCOPIC GASTRIC BANDING      Current Outpatient Medications on File Prior to Visit  Medication Sig Dispense Refill  . chlorthalidone (HYGROTON) 25 MG tablet Take 12.5 mg by mouth every morning.     . diazepam (VALIUM) 5 MG tablet Take 5 mg by mouth at bedtime as needed for sedation.     . hydrOXYzine (VISTARIL) 25 MG capsule Take 25  mg by mouth 3 (three) times daily as needed. For itching    . ibuprofen (ADVIL,MOTRIN) 800 MG tablet Take 1 tablet (800 mg total) by mouth every 8 (eight) hours as needed. 90 tablet 5  . oxyCODONE-acetaminophen (PERCOCET) 5-325 MG per tablet Take 2 tablets by mouth every 4 (four) hours as needed for severe pain. DISPENSE TO GO. 6 tablet 0  . oxyCODONE-acetaminophen (PERCOCET) 5-325 MG per tablet Take 2 tablets by mouth every 4 (four) hours as needed. 20 tablet 0  . oxyCODONE-acetaminophen (PERCOCET/ROXICET) 5-325 MG per tablet Take 1 tablet by mouth every 4 (four) hours as needed for severe pain. 40 tablet 0  . oxymetazoline (AFRIN) 0.05 % nasal spray Place 1 spray into both nostrils 2 (two) times daily as needed for congestion.    Marland Kitchen PARoxetine (PAXIL-CR) 25 MG 24 hr tablet Take 25 mg by mouth every morning.     . simvastatin (ZOCOR) 20 MG tablet Take 20 mg by mouth daily.    Marland Kitchen sulfamethoxazole-trimethoprim (BACTRIM DS) 800-160 MG per tablet Take 1 tablet by mouth 2 (two) times daily. 14 tablet 0   No current facility-administered medications on file prior to visit.    Social History   Socioeconomic History  . Marital status: Married    Spouse name: Not on file  . Number of children: Not on file  . Years of education: Not  on file  . Highest education level: Not on file  Occupational History  . Not on file  Tobacco Use  . Smoking status: Former Smoker    Packs/day: 1.00    Years: 35.00    Pack years: 35.00    Types: Cigarettes    Quit date: 12/24/2012    Years since quitting: 6.4  . Smokeless tobacco: Never Used  Substance and Sexual Activity  . Alcohol use: Yes    Comment: Occ  . Drug use: No  . Sexual activity: Yes    Birth control/protection: None  Other Topics Concern  . Not on file  Social History Narrative  . Not on file   Social Determinants of Health   Financial Resource Strain:   . Difficulty of Paying Living Expenses:   Food Insecurity:   . Worried About  Programme researcher, broadcasting/film/video in the Last Year:   . Barista in the Last Year:   Transportation Needs:   . Freight forwarder (Medical):   Marland Kitchen Lack of Transportation (Non-Medical):   Physical Activity:   . Days of Exercise per Week:   . Minutes of Exercise per Session:   Stress:   . Feeling of Stress :   Social Connections:   . Frequency of Communication with Friends and Family:   . Frequency of Social Gatherings with Friends and Family:   . Attends Religious Services:   . Active Member of Clubs or Organizations:   . Attends Banker Meetings:   Marland Kitchen Marital Status:   Intimate Partner Violence:   . Fear of Current or Ex-Partner:   . Emotionally Abused:   Marland Kitchen Physically Abused:   . Sexually Abused:     No family history on file.  BP (!) 130/105   Pulse 80   Ht 5\' 10"  (1.778 m)   Wt 259 lb 6 oz (117.7 kg)   BMI 37.22 kg/m   Body mass index is 37.22 kg/m.     Objective:   Physical Exam Vitals and nursing note reviewed.  Constitutional:      Appearance: He is well-developed.  HENT:     Head: Normocephalic and atraumatic.  Eyes:     Conjunctiva/sclera: Conjunctivae normal.     Pupils: Pupils are equal, round, and reactive to light.  Cardiovascular:     Rate and Rhythm: Normal rate and regular rhythm.  Pulmonary:     Effort: Pulmonary effort is normal.  Abdominal:     Palpations: Abdomen is soft.  Musculoskeletal:     Cervical back: Normal range of motion and neck supple.       Legs:  Skin:    General: Skin is warm and dry.  Neurological:     Mental Status: He is alert and oriented to person, place, and time.     Cranial Nerves: No cranial nerve deficit.     Motor: No abnormal muscle tone.     Coordination: Coordination normal.     Deep Tendon Reflexes: Reflexes are normal and symmetric. Reflexes normal.  Psychiatric:        Behavior: Behavior normal.        Thought Content: Thought content normal.        Judgment: Judgment normal.    X-rays were  done of the right knee, three views, reported separately.       Assessment & Plan:   Encounter Diagnosis  Name Primary?  . Chronic pain of right knee Yes   PROCEDURE NOTE:  The patient requests injections of the right knee , verbal consent was obtained.  The right knee was prepped appropriately after time out was performed.   Sterile technique was observed and injection of 1 cc of Depo-Medrol 40 mg with several cc's of plain xylocaine. Anesthesia was provided by ethyl chloride and a 20-gauge needle was used to inject the knee area. The injection was tolerated well.  A band aid dressing was applied.  The patient was advised to apply ice later today and tomorrow to the injection sight as needed.  I am concerned about possible medial meniscus tear as well as ACL problem  I will call in Naprosyn 500 po bid pc  Return in three weeks.  Call if any problem.  Precautions discussed.   Electronically Signed Darreld Mclean, MD 5/20/20211:55 PM

## 2019-06-14 ENCOUNTER — Encounter: Payer: Self-pay | Admitting: Orthopaedic Surgery

## 2019-06-14 ENCOUNTER — Ambulatory Visit (INDEPENDENT_AMBULATORY_CARE_PROVIDER_SITE_OTHER): Payer: Federal, State, Local not specified - PPO | Admitting: Orthopaedic Surgery

## 2019-06-14 ENCOUNTER — Other Ambulatory Visit: Payer: Self-pay

## 2019-06-14 VITALS — BP 127/89 | HR 72 | Ht 71.0 in | Wt 262.2 lb

## 2019-06-14 DIAGNOSIS — F17210 Nicotine dependence, cigarettes, uncomplicated: Secondary | ICD-10-CM

## 2019-06-14 DIAGNOSIS — G8929 Other chronic pain: Secondary | ICD-10-CM | POA: Diagnosis not present

## 2019-06-14 DIAGNOSIS — M25561 Pain in right knee: Secondary | ICD-10-CM | POA: Diagnosis not present

## 2019-06-14 NOTE — Progress Notes (Signed)
I am much better  He has little pain of the right knee now.  The injection helped.  He is tolerating the Naprosyn.  NV intact. Gait normal.  Encounter Diagnoses  Name Primary?  . Chronic pain of right knee Yes  . Nicotine dependence, cigarettes, uncomplicated    I will see as needed.  Call if any problem.  Precautions discussed.   Electronically Signed Darreld Mclean, MD 6/10/20219:36 AM

## 2019-06-14 NOTE — Patient Instructions (Signed)

## 2019-06-29 ENCOUNTER — Emergency Department (HOSPITAL_COMMUNITY)
Admission: EM | Admit: 2019-06-29 | Discharge: 2019-06-29 | Disposition: A | Discharge status: Home / Self Care | Payer: Federal, State, Local not specified - PPO | Attending: Emergency Medicine | Admitting: Emergency Medicine

## 2019-06-29 ENCOUNTER — Other Ambulatory Visit: Payer: Self-pay

## 2019-06-29 ENCOUNTER — Emergency Department (HOSPITAL_COMMUNITY): Payer: Federal, State, Local not specified - PPO

## 2019-06-29 DIAGNOSIS — M79605 Pain in left leg: Secondary | ICD-10-CM | POA: Diagnosis not present

## 2019-06-29 DIAGNOSIS — Y939 Activity, unspecified: Secondary | ICD-10-CM | POA: Insufficient documentation

## 2019-06-29 DIAGNOSIS — I10 Essential (primary) hypertension: Secondary | ICD-10-CM | POA: Insufficient documentation

## 2019-06-29 DIAGNOSIS — S3993XA Unspecified injury of pelvis, initial encounter: Secondary | ICD-10-CM | POA: Diagnosis not present

## 2019-06-29 DIAGNOSIS — Y999 Unspecified external cause status: Secondary | ICD-10-CM | POA: Diagnosis not present

## 2019-06-29 DIAGNOSIS — S8992XA Unspecified injury of left lower leg, initial encounter: Secondary | ICD-10-CM | POA: Diagnosis not present

## 2019-06-29 DIAGNOSIS — R52 Pain, unspecified: Secondary | ICD-10-CM | POA: Diagnosis not present

## 2019-06-29 DIAGNOSIS — I959 Hypotension, unspecified: Secondary | ICD-10-CM | POA: Diagnosis not present

## 2019-06-29 DIAGNOSIS — Z87891 Personal history of nicotine dependence: Secondary | ICD-10-CM | POA: Diagnosis not present

## 2019-06-29 DIAGNOSIS — Y929 Unspecified place or not applicable: Secondary | ICD-10-CM | POA: Insufficient documentation

## 2019-06-29 DIAGNOSIS — W010XXA Fall on same level from slipping, tripping and stumbling without subsequent striking against object, initial encounter: Secondary | ICD-10-CM | POA: Insufficient documentation

## 2019-06-29 DIAGNOSIS — Z79899 Other long term (current) drug therapy: Secondary | ICD-10-CM | POA: Insufficient documentation

## 2019-06-29 DIAGNOSIS — S79922A Unspecified injury of left thigh, initial encounter: Secondary | ICD-10-CM | POA: Diagnosis not present

## 2019-06-29 DIAGNOSIS — S76312A Strain of muscle, fascia and tendon of the posterior muscle group at thigh level, left thigh, initial encounter: Secondary | ICD-10-CM | POA: Diagnosis not present

## 2019-06-29 DIAGNOSIS — S76302A Unspecified injury of muscle, fascia and tendon of the posterior muscle group at thigh level, left thigh, initial encounter: Secondary | ICD-10-CM

## 2019-06-29 DIAGNOSIS — W19XXXA Unspecified fall, initial encounter: Secondary | ICD-10-CM | POA: Diagnosis not present

## 2019-06-29 MED ORDER — OXYCODONE-ACETAMINOPHEN 5-325 MG PO TABS
2.0000 | ORAL_TABLET | Freq: Once | ORAL | Status: AC
  Administered 2019-06-29: 2 via ORAL
  Filled 2019-06-29: qty 2

## 2019-06-29 MED ORDER — METHOCARBAMOL 500 MG PO TABS
500.0000 mg | ORAL_TABLET | Freq: Once | ORAL | Status: AC
  Administered 2019-06-29: 500 mg via ORAL
  Filled 2019-06-29: qty 1

## 2019-06-29 MED ORDER — NAPROXEN 500 MG PO TABS: 500.0000 mg | ORAL_TABLET | Freq: Two times a day (BID) | ORAL | 0 refills | Status: AC

## 2019-06-29 MED ORDER — OXYCODONE-ACETAMINOPHEN 5-325 MG PO TABS: 1.0000 | ORAL_TABLET | ORAL | 0 refills | Status: DC | PRN

## 2019-06-29 MED ORDER — METHOCARBAMOL 500 MG PO TABS: 500.0000 mg | ORAL_TABLET | Freq: Two times a day (BID) | ORAL | 0 refills | Status: DC

## 2019-06-29 NOTE — Discharge Instructions (Signed)
You presented to the ED after a fall with left leg pain without joint pain in the knee or the hip.  Your x-rays showed no fractures.  Your pain improved with oral medications.  You most likely have a moderate to severe hamstring injury.  Your written prescription for narcotic, NSAID and muscle relaxant medications.  You may follow-up with your PCP for physical therapy/further evaluation of injury.  Recommend resting from 4 to 6 days.  If your PCP does not feel comfortable with your injury you may follow-up with your orthopedist.  Use crutches as needed.  You may also try an Ace wrap for support of this leg.

## 2019-06-29 NOTE — ED Notes (Signed)
Ace band applied. Pt instructed on how to use crutches.

## 2019-06-29 NOTE — ED Provider Notes (Signed)
[facility] EMERGENCY DEPARTMENT Provider Note   CSN: [id number] Arrival date & time: 06/29/19  1953     History Chief Complaint  Patient presents with  . Leg Injury    Erik Wright is a 64 y.o. male.  Patient presenting from home after slipping and having a ground-level fall in which she stretched his left leg.  Patient denies any knee or hip pain.  Head trauma or loss of consciousness.  Not on blood thinners.  The history is provided by the patient and medical records.  Illness Location:  Left leg Quality:  Pain Severity:  Severe Onset quality:  Sudden Timing:  Constant Progression:  Unchanged Chronicity:  New Context:  Patient fell and feels like he has a hamstring injury Relieved by:  Immobilization Worsened by:  Straightening leg, ambulation Ineffective treatments:  None tried Associated symptoms: myalgias   Associated symptoms: no abdominal pain, no chest pain, no congestion, no headaches, no loss of consciousness, no nausea, no shortness of breath and no vomiting        Past Medical History:  Diagnosis Date  . Anxiety   . Chronic venous insufficiency   . History of gout   . Hypercholesteremia   . Hypertension     Patient Active Problem List   Diagnosis Date Noted  . Derangement of posterior horn of medial meniscus 02/01/2013  . Back pain without radiation 12/20/2012  . Osteoarthritis of right knee 12/20/2012  . Left ankle pain 12/20/2012  . Medial meniscus, posterior horn derangement 12/20/2012  . Knee pain 12/20/2012  . Fractured great toe 06/26/2012  . Constipation 06/26/2012    Past Surgical History:  Procedure Laterality Date  . BACK SURGERY     lumbar disc  . KNEE ARTHROSCOPY WITH MEDIAL MENISECTOMY Right 02/09/2013   Procedure: KNEE ARTHROSCOPY WITH MEDIAL MENISECTOMY with limited debridement;  Surgeon: Vickki Hearing, MD;  Location: [facility];  Service: Orthopedics;  Laterality: Right;  . LAPAROSCOPIC GASTRIC BANDING           No family history on file.  Social History   Tobacco Use  . Smoking status: Former Smoker    Packs/day: 1.00    Years: 35.00    Pack years: 35.00    Types: Cigarettes    Quit date: 12/24/2012    Years since quitting: 6.5  . Smokeless tobacco: Never Used  Substance Use Topics  . Alcohol use: Yes    Comment: Occ  . Drug use: No    Home Medications Prior to Admission medications   Medication Sig Start Date End Date Taking? Authorizing Provider  chlorthalidone (HYGROTON) 25 MG tablet Take 12.5 mg by mouth every morning.     [provider]  diazepam (VALIUM) 5 MG tablet Take 5 mg by mouth at bedtime as needed for sedation.     [provider]  hydrOXYzine (VISTARIL) 25 MG capsule Take 25 mg by mouth 3 (three) times daily as needed. For itching 05/01/13   [provider]  ibuprofen (ADVIL,MOTRIN) 800 MG tablet Take 1 tablet (800 mg total) by mouth every 8 (eight) hours as needed. 09/19/13   Vickki Hearing, MD  methocarbamol (ROBAXIN) 500 MG tablet Take 1 tablet (500 mg total) by mouth 2 (two) times daily. 06/29/19   Janeece Fitting, MD  naproxen (NAPROSYN) 500 MG tablet Take 1 tablet (500 mg total) by mouth 2 (two) times daily with a meal. 05/24/19   Darreld Mclean, MD  naproxen (NAPROSYN) 500 MG tablet  Take 1 tablet (500 mg total) by mouth 2 (two) times daily with a meal for 10 days. 06/29/19 07/09/19  Janeece Fitting, MD  oxyCODONE-acetaminophen (PERCOCET/ROXICET) 5-325 MG tablet Take 1-2 tablets by mouth every 4 (four) hours as needed for up to 24 doses (1 tablet for moderate pain, 2 tablets for severe pain.). 06/29/19   Janeece Fitting, MD  oxymetazoline (AFRIN) 0.05 % nasal spray Place 1 spray into both nostrils 2 (two) times daily as needed for congestion.    [provider]  PARoxetine (PAXIL-CR) 25 MG 24 hr tablet Take 25 mg by mouth every morning.     [provider]  simvastatin (ZOCOR) 20 MG tablet Take 20 mg by mouth daily.     [provider]  sulfamethoxazole-trimethoprim (BACTRIM DS) 800-160 MG per tablet Take 1 tablet by mouth 2 (two) times daily. 05/06/13   Wayland Salinas, MD    Allergies    Patient has no known allergies.  Review of Systems   Review of Systems  HENT: Negative for congestion.   Respiratory: Negative for shortness of breath.   Cardiovascular: Negative for chest pain.  Gastrointestinal: Negative for abdominal pain, nausea and vomiting.  Musculoskeletal: Positive for gait problem and myalgias.  Skin: Negative for wound.  Neurological: Negative for loss of consciousness and headaches.  All other systems reviewed and are negative.   Physical Exam Updated Vital Signs BP 122/79   Pulse 76   Temp 98.3 F (36.8 C) (Oral)   Resp (!) 23   Ht 5\' 10"  (1.778 m)   Wt 117.9 kg   SpO2 94%   BMI 37.31 kg/m   Physical Exam Vitals and nursing note reviewed.  Constitutional:      General: He is in acute distress.     Appearance: He is well-developed.  HENT:     Head: Normocephalic and atraumatic.     Right Ear: External ear normal.     Left Ear: External ear normal.     Nose: Nose normal.     Mouth/Throat:     Mouth: Mucous membranes are moist.  Eyes:     Conjunctiva/sclera: Conjunctivae normal.  Cardiovascular:     Rate and Rhythm: Normal rate and regular rhythm.     Heart sounds: No murmur heard.   Pulmonary:     Effort: Pulmonary effort is normal. No respiratory distress.     Breath sounds: Normal breath sounds.  Abdominal:     Palpations: Abdomen is soft.     Tenderness: There is no abdominal tenderness.  Musculoskeletal:        General: Tenderness and signs of injury present. No deformity.     Cervical back: Neck supple.     Right lower leg: No edema.     Left lower leg: No edema.  Skin:    General: Skin is warm and dry.  Neurological:     General: No focal deficit present.     Mental Status: He is alert and oriented to person, place, and time.  Psychiatric:         Mood and Affect: Mood normal.        Behavior: Behavior normal.     ED Results / Procedures / Treatments   Labs (all labs ordered are listed, but only abnormal results are displayed) Labs Reviewed - No data to display  EKG None  Radiology DG Pelvis 1-2 Views  Result Date: 06/29/2019 CLINICAL DATA:  64 year old male with fall and trauma to the left lower extremity.  EXAM: LEFT FEMUR 2 VIEWS; LEFT KNEE - 1-2 VIEW; PELVIS - 1-2 VIEW COMPARISON:  None. FINDINGS: There is no acute fracture or dislocation. The bones are well mineralized. No significant arthritic changes. No joint effusion. The soft tissues are unremarkable. IMPRESSION: Negative. Electronically Signed   By: Anner Crete M.D.   On: 06/29/2019 22:51   DG Knee 2 Views Left  Result Date: 06/29/2019 CLINICAL DATA:  64 year old male with fall and trauma to the left lower extremity. EXAM: LEFT FEMUR 2 VIEWS; LEFT KNEE - 1-2 VIEW; PELVIS - 1-2 VIEW COMPARISON:  None. FINDINGS: There is no acute fracture or dislocation. The bones are well mineralized. No significant arthritic changes. No joint effusion. The soft tissues are unremarkable. IMPRESSION: Negative. Electronically Signed   By: Anner Crete M.D.   On: 06/29/2019 22:51   DG Femur Min 2 Views Left  Result Date: 06/29/2019 CLINICAL DATA:  64 year old male with fall and trauma to the left lower extremity. EXAM: LEFT FEMUR 2 VIEWS; LEFT KNEE - 1-2 VIEW; PELVIS - 1-2 VIEW COMPARISON:  None. FINDINGS: There is no acute fracture or dislocation. The bones are well mineralized. No significant arthritic changes. No joint effusion. The soft tissues are unremarkable. IMPRESSION: Negative. Electronically Signed   By: Anner Crete M.D.   On: 06/29/2019 22:51    Procedures Procedures (including critical care time)  Medications Ordered in ED Medications  oxyCODONE-acetaminophen (PERCOCET/ROXICET) 5-325 MG per tablet 2 tablet (2 tablets Oral Given 06/29/19 2052)  methocarbamol  (ROBAXIN) tablet 500 mg (500 mg Oral Given 06/29/19 2052)    ED Course  I have reviewed the triage vital signs and the nursing notes.  Pertinent labs & imaging results that were available during my care of the patient were reviewed by me and considered in my medical decision making (see chart for details).    MDM Rules/Calculators/A&P                          Differential diagnosis: Femur fracture, hip fracture, knee fracture, hamstring injury  ED physician interpretation of imaging: X-ray of the left femur, left knee, pelvis without signs of fracture or dislocation.  MDM: Patient is a relatively healthy 64 year old male who had a ground-level fall slipping on water resulting in stretching of his legs as he fell with severe posterior upper leg pain with no bony tenderness or joint pain with unremarkable x-rays most likely has a moderate to severe hamstring injury appropriate for outpatient follow-up.  Patient's vital signs are stable.  Patient is afebrile.  Patient's physical exam is remarkable for tenderness over the posterior aspect of the left leg but no bony tenderness over the hips or knee.  Patient has difficulty fully extending the leg.  This is most consistent with a hamstring injury.  Patient's pain improved with oral narcotic pain medication as well as Robaxin.  As patient has no fractures he is appropriate for outpatient follow-up.  Discharge instructions given.  Prescriptions written for naproxen, Percocet and Robaxin.  Diagnosis, treatment and plan of care was discussed and agreed upon with patient.  Patient comfortable with discharge at this time.   Key discharge instructions: You presented to the ED after a fall with left leg pain without joint pain in the knee or the hip.  Your x-rays showed no fractures.  Your pain improved with oral medications.  You most likely have a moderate to severe hamstring injury.  Your written prescription for narcotic, NSAID and muscle  relaxant  medications.  You may follow-up with your PCP for physical therapy/further evaluation of injury.  Recommend resting from 4 to 6 days.  If your PCP does not feel comfortable with your injury you may follow-up with your orthopedist.  Use crutches as needed.  You may also try an Ace wrap for support of this leg.   Final Clinical Impression(s) / ED Diagnoses Final diagnoses:  Left leg pain  Hamstring injury, left, initial encounter    Rx / DC Orders ED Discharge Orders         Ordered    methocarbamol (ROBAXIN) 500 MG tablet  2 times daily     Discontinue  Reprint     06/29/19 2308    oxyCODONE-acetaminophen (PERCOCET/ROXICET) 5-325 MG tablet  Every 4 hours PRN     Discontinue  Reprint     06/29/19 2308    naproxen (NAPROSYN) 500 MG tablet  2 times daily with meals     Discontinue  Reprint     06/29/19 2308           Janeece Fitting, MD 06/30/19 0001    Benjiman Core, MD 06/30/19 2011

## 2019-06-29 NOTE — ED Notes (Signed)
Pt. Transported to Xray 

## 2019-06-29 NOTE — ED Triage Notes (Addendum)
Pt. Transferred via EMS said he was washing his tractor when he slipped on the grass and fell. L leg went back and hasn't been able to move leg since due to pain.

## 2019-07-10 ENCOUNTER — Telehealth: Payer: Self-pay | Admitting: Orthopaedic Surgery

## 2019-07-10 NOTE — Telephone Encounter (Signed)
Patient called, 10:29am this morning to relay he was seen at [facility] Emergency department on 06/29/19 for new problem/injury - left hamstring - said "it is torn, but no surgery needed" . States today is first day he was able to call; said now has walker, previously had been in a wheelchair.  States his issue is that he cannot sit and wait in office.  Please review and please advise. Patient's ph# is [phone].

## 2019-07-10 NOTE — Telephone Encounter (Signed)
If he is wanting an appointment, he will just need to come on in, and we will get him back as soon as we can, same as we usually do.  I don't know what else to tell him.  I guess try to give him an appt at the beginning of a schedule?

## 2019-07-11 NOTE — Telephone Encounter (Signed)
I have called and left a message on his phone to call to call for an appointment time for tomorrow.  I also called and left a message on his wife's phone to have him call our office.

## 2019-07-11 NOTE — Telephone Encounter (Signed)
Appointment has been scheduled. Patient aware

## 2019-07-12 ENCOUNTER — Ambulatory Visit (INDEPENDENT_AMBULATORY_CARE_PROVIDER_SITE_OTHER): Payer: Federal, State, Local not specified - PPO | Admitting: Orthopaedic Surgery

## 2019-07-12 ENCOUNTER — Other Ambulatory Visit: Payer: Self-pay

## 2019-07-12 ENCOUNTER — Encounter: Payer: Self-pay | Admitting: Orthopaedic Surgery

## 2019-07-12 VITALS — BP 114/81 | HR 95 | Resp 16 | Ht 70.0 in | Wt 261.0 lb

## 2019-07-12 DIAGNOSIS — S76302A Unspecified injury of muscle, fascia and tendon of the posterior muscle group at thigh level, left thigh, initial encounter: Secondary | ICD-10-CM | POA: Diagnosis not present

## 2019-07-12 MED ORDER — METHOCARBAMOL 500 MG PO TABS: 500.0000 mg | ORAL_TABLET | Freq: Two times a day (BID) | ORAL | 0 refills | Status: DC

## 2019-07-12 MED ORDER — OXYCODONE-ACETAMINOPHEN 5-325 MG PO TABS: ORAL_TABLET | ORAL | 0 refills | Status: DC

## 2019-07-12 NOTE — Progress Notes (Signed)
Patient Erik Wright Wright, male DOB:[birth year], 64 y.o. EXH:[id number]  Chief Complaint  Patient presents with   left hamstring    HPI  Erik Wright Wright is a 64 y.o. male who has a new injury.  He did a "split" slipping on wet grass on 06-29-2019.  He was taken to [facility] ER.  He has a medial hamstring rupture on the left leg.  He was evaluated in the ER.  He had x-rays.  I have independently reviewed and interpreted x-rays of this patient done at another site by another physician or qualified health professional.  I have reviewed the notes from the ER.  He was given Robaxin, Percocet 5 and told to continue his Naprosyn that I had given him for a knee problem.  He has a walker.  He is in pain.  He cannot sit long.  He has pain at the origin of the hamstring in the pelvis and has ecchymosis resolving of the mid to lower upper medial thigh.  He has no redness.  He has no fever.  He just hurts.   Body mass index is 37.45 kg/m.  ROS  Review of Systems  Constitutional: Positive for activity change.  Musculoskeletal: Positive for arthralgias, gait problem and joint swelling.  All other systems reviewed and are negative.   All other systems reviewed and are negative.  The following is a summary of the past history medically, past history surgically, known current medicines, social history and family history.  This information is gathered electronically by the computer from prior information and documentation.  I review this each visit and have found including this information at this point in the chart is beneficial and informative.    Past Medical History:  Diagnosis Date   Anxiety    Chronic venous insufficiency    History of gout    Hypercholesteremia    Hypertension     Past Surgical History:  Procedure Laterality Date   BACK SURGERY     lumbar disc   KNEE ARTHROSCOPY WITH MEDIAL MENISECTOMY Right 02/09/2013   Procedure: KNEE ARTHROSCOPY WITH MEDIAL MENISECTOMY with  limited debridement;  Surgeon: Vickki Hearing, MD;  Location: [facility];  Service: Orthopedics;  Laterality: Right;   LAPAROSCOPIC GASTRIC BANDING      History reviewed. No pertinent family history.  Social History Social History   Tobacco Use   Smoking status: Former Smoker    Packs/day: 1.00    Years: 35.00    Pack years: 35.00    Types: Cigarettes    Quit date: 12/24/2012    Years since quitting: 6.5   Smokeless tobacco: Never Used  Substance Use Topics   Alcohol use: Yes    Comment: Occ   Drug use: No    No Known Allergies  Current Outpatient Medications  Medication Sig Dispense Refill   chlorthalidone (HYGROTON) 25 MG tablet Take 12.5 mg by mouth every morning.      diazepam (VALIUM) 5 MG tablet Take 5 mg by mouth at bedtime as needed for sedation.      hydrOXYzine (VISTARIL) 25 MG capsule Take 25 mg by mouth 3 (three) times daily as needed. For itching     ibuprofen (ADVIL,MOTRIN) 800 MG tablet Take 1 tablet (800 mg total) by mouth every 8 (eight) hours as needed. 90 tablet 5   methocarbamol (ROBAXIN) 500 MG tablet Take 1 tablet (500 mg total) by mouth 2 (two) times daily. 30 tablet 0   naproxen (NAPROSYN) 500 MG tablet Take 1  tablet (500 mg total) by mouth 2 (two) times daily with a meal. 60 tablet 5   oxymetazoline (AFRIN) 0.05 % nasal spray Place 1 spray into both nostrils 2 (two) times daily as needed for congestion.     PARoxetine (PAXIL-CR) 25 MG 24 hr tablet Take 25 mg by mouth every morning.      simvastatin (ZOCOR) 20 MG tablet Take 20 mg by mouth daily.     sulfamethoxazole-trimethoprim (BACTRIM DS) 800-160 MG per tablet Take 1 tablet by mouth 2 (two) times daily. 14 tablet 0   oxyCODONE-acetaminophen (PERCOCET/ROXICET) 5-325 MG tablet One tablet every four hours as need for pain,  5 day limit. 25 tablet 0   No current facility-administered medications for this visit.     Physical Exam  Blood pressure 114/81, pulse 95, resp. rate 16,  height 5\' 10"  (1.778 m), weight 261 lb (118.4 kg).  Constitutional: overall normal hygiene, normal nutrition, well developed, normal grooming, normal body habitus. Assistive device:walker  Musculoskeletal: gait and station Limp left, muscle tone and strength are normal, no tremors or atrophy is present.  .  Neurological: coordination overall normal.  Deep tendon reflex/nerve stretch intact.  Sensation normal.  Cranial nerves II-XII intact.   Skin:   Normal overall no scars, lesions, ulcers or rashes. No psoriasis.  Psychiatric: Alert and oriented x 3.  Recent memory intact, remote memory unclear.  Normal mood and affect. Well groomed.  Good eye contact.  Cardiovascular: overall no swelling, no varicosities, no edema bilaterally, normal temperatures of the legs and arms, no clubbing, cyanosis and good capillary refill.  Lymphatic: palpation is normal.  Left mid thigh has swelling and resolving ecchymosis.  He is very tender on the medial thigh distally and at the insertion of hamstring on pelvis.  He has pain moving but can extend his knee.  NV intact.  He is in pain.  All other systems reviewed and are negative   The patient has been educated about the nature of the problem(s) and counseled on treatment options.  The patient appeared to understand what I have discussed and is in agreement with it.  Encounter Diagnosis  Name Primary?   Left hamstring injury, initial encounter Yes    PLAN Call if any problems.  Precautions discussed.  Continue current medications.   Return to clinic 1 week   I have renewed his pain medicine and Robaxin.  I have reviewed the Controlled Substance Reporting System web site prior to prescribing narcotic medicine for this patient.   Use heat/ice as needed.  Use Aspercreme, Biofreeze or Voltaren Gel as needed.  I will re-evaluate next week.  He Erik Wright need PT.  Electronically Signed [place], MD 7/8/20218:34 AM

## 2019-07-12 NOTE — Addendum Note (Signed)
Addended by: Earnstine Regal on: 07/12/2019 08:36 AM   Modules accepted: Orders

## 2019-07-19 ENCOUNTER — Encounter: Payer: Self-pay | Admitting: Orthopaedic Surgery

## 2019-07-19 ENCOUNTER — Other Ambulatory Visit: Payer: Self-pay

## 2019-07-19 ENCOUNTER — Ambulatory Visit (INDEPENDENT_AMBULATORY_CARE_PROVIDER_SITE_OTHER): Payer: Federal, State, Local not specified - PPO | Admitting: Orthopaedic Surgery

## 2019-07-19 VITALS — BP 121/79 | HR 69 | Ht 70.0 in | Wt 262.0 lb

## 2019-07-19 DIAGNOSIS — S76302D Unspecified injury of muscle, fascia and tendon of the posterior muscle group at thigh level, left thigh, subsequent encounter: Secondary | ICD-10-CM | POA: Diagnosis not present

## 2019-07-19 MED ORDER — OXYCODONE-ACETAMINOPHEN 5-325 MG PO TABS: 1.0000 | ORAL_TABLET | Freq: Four times a day (QID) | ORAL | 0 refills | Status: AC | PRN

## 2019-07-19 NOTE — Progress Notes (Signed)
Patient Erik Wright, male DOB:[birth year], 64 y.o. XBD:[id number]  Chief Complaint  Patient presents with   Leg Pain    left hamstring    HPI  Erik Wright is a 64 y.o. male who has hamstring injury left.  He is using his walker.  He has made some progress since last seen but still has pain and still is limited in sitting.  He has no new injury.  I will set up PT.  I have suggested a different type of walker.  I want him to be as active as he can be.  Continue the Naprosyn.  Continue the Biofreeze.   Body mass index is 37.59 kg/m.  ROS  Review of Systems  Constitutional: Positive for activity change.  Musculoskeletal: Positive for arthralgias, gait problem and joint swelling.  All other systems reviewed and are negative.   All other systems reviewed and are negative.  The following is a summary of the past history medically, past history surgically, known current medicines, social history and family history.  This information is gathered electronically by the computer from prior information and documentation.  I review this each visit and have found including this information at this point in the chart is beneficial and informative.    Past Medical History:  Diagnosis Date   Anxiety    Chronic venous insufficiency    History of gout    Hypercholesteremia    Hypertension     Past Surgical History:  Procedure Laterality Date   BACK SURGERY     lumbar disc   KNEE ARTHROSCOPY WITH MEDIAL MENISECTOMY Right 02/09/2013   Procedure: KNEE ARTHROSCOPY WITH MEDIAL MENISECTOMY with limited debridement;  Surgeon: Vickki Hearing, MD;  Location: [facility];  Service: Orthopedics;  Laterality: Right;   LAPAROSCOPIC GASTRIC BANDING      No family history on file.  Social History Social History   Tobacco Use   Smoking status: Former Smoker    Packs/day: 1.00    Years: 35.00    Pack years: 35.00    Types: Cigarettes    Quit date: 12/24/2012    Years since  quitting: 6.5   Smokeless tobacco: Never Used  Substance Use Topics   Alcohol use: Yes    Comment: Occ   Drug use: No    No Known Allergies  Current Outpatient Medications  Medication Sig Dispense Refill   chlorthalidone (HYGROTON) 25 MG tablet Take 12.5 mg by mouth every morning.      ibuprofen (ADVIL,MOTRIN) 800 MG tablet Take 1 tablet (800 mg total) by mouth every 8 (eight) hours as needed. 90 tablet 5   methocarbamol (ROBAXIN) 500 MG tablet Take 1 tablet (500 mg total) by mouth 2 (two) times daily. 30 tablet 0   naproxen (NAPROSYN) 500 MG tablet Take 1 tablet (500 mg total) by mouth 2 (two) times daily with a meal. 60 tablet 5   oxyCODONE-acetaminophen (PERCOCET/ROXICET) 5-325 MG tablet One tablet every four hours as need for pain,  5 day limit. 25 tablet 0   oxymetazoline (AFRIN) 0.05 % nasal spray Place 1 spray into both nostrils 2 (two) times daily as needed for congestion.     PARoxetine (PAXIL-CR) 25 MG 24 hr tablet Take 25 mg by mouth every morning.      simvastatin (ZOCOR) 20 MG tablet Take 20 mg by mouth daily.     diazepam (VALIUM) 5 MG tablet Take 5 mg by mouth at bedtime as needed for sedation.  (Patient not taking: Reported  on 07/19/2019)     hydrOXYzine (VISTARIL) 25 MG capsule Take 25 mg by mouth 3 (three) times daily as needed. For itching (Patient not taking: Reported on 07/19/2019)     sulfamethoxazole-trimethoprim (BACTRIM DS) 800-160 MG per tablet Take 1 tablet by mouth 2 (two) times daily. (Patient not taking: Reported on 07/19/2019) 14 tablet 0   No current facility-administered medications for this visit.     Physical Exam  Blood pressure 121/79, pulse 69, height 5\' 10"  (1.778 m), weight 262 lb (118.8 kg).  Constitutional: overall normal hygiene, normal nutrition, well developed, normal grooming, normal body habitus. Assistive device:walker  Musculoskeletal: gait and station Limp left, muscle tone and strength are normal, no tremors or atrophy  is present.  .  Neurological: coordination overall normal.  Deep tendon reflex/nerve stretch intact.  Sensation normal.  Cranial nerves II-XII intact.   Skin:   Normal overall no scars, lesions, ulcers or rashes. No psoriasis.  Psychiatric: Alert and oriented x 3.  Recent memory intact, remote memory unclear.  Normal mood and affect. Well groomed.  Good eye contact.  Cardiovascular: overall no swelling, no varicosities, no edema bilaterally, normal temperatures of the legs and arms, no clubbing, cyanosis and good capillary refill.  Lymphatic: palpation is normal.  He is very tender over the left pelvis origin of hamstrings.  NV intact.  He can fully extend his knee today.    All other systems reviewed and are negative   The patient has been educated about the nature of the problem(s) and counseled on treatment options.  The patient appeared to understand what I have discussed and is in agreement with it.  Encounter Diagnosis  Name Primary?   Left hamstring injury, subsequent encounter Yes    PLAN Call if any problems.  Precautions discussed.  Continue current medications.   Return to clinic 2 weeks   I have reviewed the [facility] Controlled Substance Reporting System web site prior to prescribing narcotic medicine for this patient.   Electronically Signed [facility], MD 7/15/20218:38 AM

## 2019-07-24 ENCOUNTER — Ambulatory Visit (HOSPITAL_COMMUNITY): Payer: Federal, State, Local not specified - PPO | Attending: Orthopaedic Surgery | Admitting: Physical Therapy

## 2019-07-24 ENCOUNTER — Other Ambulatory Visit: Payer: Self-pay

## 2019-07-24 ENCOUNTER — Encounter (HOSPITAL_COMMUNITY): Payer: Self-pay | Admitting: Physical Therapy

## 2019-07-24 DIAGNOSIS — M79605 Pain in left leg: Secondary | ICD-10-CM | POA: Diagnosis not present

## 2019-07-24 DIAGNOSIS — R29898 Other symptoms and signs involving the musculoskeletal system: Secondary | ICD-10-CM | POA: Diagnosis not present

## 2019-07-24 DIAGNOSIS — R2689 Other abnormalities of gait and mobility: Secondary | ICD-10-CM | POA: Insufficient documentation

## 2019-07-24 NOTE — Patient Instructions (Signed)
Access Code: [email or web address] URL: [email or web address] Date: 07/24/2019 Prepared by: Georges Lynch  Exercises Supine Heel Slide - 3 x daily - 7 x weekly - 2 sets - 10 reps Supine Quad Set - 3 x daily - 7 x weekly - 2 sets - 10 reps - 5 hold

## 2019-07-24 NOTE — Therapy (Signed)
[facility] [address] [place], [place], [address] Phone: [phone]   Fax:  [phone]  Physical Therapy Evaluation  Patient Details  Name: Erik Wright MRN: [id number] Date of Birth: 02/24/1955 Referring Provider (PT): Darreld Mclean MD    Encounter Date: 07/24/2019   PT End of Session - 07/24/19 0942    Visit Number 1    Number of Visits 8    Date for PT Re-Evaluation 08/24/19    Authorization Type BCBS Federal EMP PPO (25 visit limit combined)    Authorization - Visit Number 1    Authorization - Number of Visits 25    Progress Note Due on Visit 8    PT Start Time 0901    PT Stop Time 0945    PT Time Calculation (min) 44 min    Activity Tolerance Patient limited by pain    Behavior During Therapy [facility] for tasks assessed/performed           Past Medical History:  Diagnosis Date  . Anxiety   . Chronic venous insufficiency   . History of gout   . Hypercholesteremia   . Hypertension     Past Surgical History:  Procedure Laterality Date  . BACK SURGERY     lumbar disc  . KNEE ARTHROSCOPY WITH MEDIAL MENISECTOMY Right 02/09/2013   Procedure: KNEE ARTHROSCOPY WITH MEDIAL MENISECTOMY with limited debridement;  Surgeon: Vickki Hearing, MD;  Location: [facility];  Service: Orthopedics;  Laterality: Right;  . LAPAROSCOPIC GASTRIC BANDING      There were no vitals filed for this visit.    Subjective Assessment - 07/24/19 0907    Subjective Patient presents to physical therapy with complaint of LT leg/ hamstring pain. Patient states on 06/29/19 he slipped on wet grass resulting in LLE sliding out from under him. Patient went to ER, and was diagnosed with hamstring tear and was placed on crutches and walker for ambulating. Patient says he just started walking without AD a few days ago. Patient says he is still having moderate pain and has difficulty sitting directly on LT hip area. Patient reports he is currently taking pain meds  for this.    Limitations Sitting;House hold activities;Lifting;Standing;Walking    How long can you sit comfortably? 5 minutes    How long can you stand comfortably? Has not tried    How long can you walk comfortably? Has not tried    Diagnostic tests Xrays    Patient Stated Goals Get my legs back, fix this muscle    Currently in Pain? Yes    Pain Score 7     Pain Location Leg    Pain Orientation Left    Pain Descriptors / Indicators Pressure    Pain Type Acute pain    Pain Onset 1 to 4 weeks ago    Pain Frequency Constant    Aggravating Factors  sitting, standing, walking, stairs    Pain Relieving Factors laying on RT side    Effect of Pain on Daily Activities Limits              [facility] PT Assessment - 07/24/19 0001      Assessment   Medical Diagnosis LT hamstring injury    Referring Provider (PT) Darreld Mclean MD     Onset Date/Surgical Date 06/29/19    Next MD Visit 08/02/19    Prior Therapy No       Precautions   Precautions None  Restrictions   Weight Bearing Restrictions No      Balance Screen   Has the patient fallen in the past 6 months No      Home Environment   Living Environment Private residence    Living Arrangements Spouse/significant other    Available Help at Discharge Family    Type of Home House    Home Access Stairs to enter    Entrance Stairs-Number of Steps 2    Home Layout One level      Prior Function   Level of Independence Independent      Cognition   Overall Cognitive Status Within Functional Limits for tasks assessed      Observation/Other Assessments   Focus on Therapeutic Outcomes (FOTO)  61% limited       ROM / Strength   AROM / PROM / Strength AROM;Strength      AROM   Overall AROM Comments Bilat hip and knee AROM WFL except LT hip extension limited by pain       Strength   Strength Assessment Site Hip    Right/Left Hip Right;Left    Right Hip Flexion 4+/5    Right Hip Extension 4-/5    Right Hip ABduction 4/5     Left Hip Flexion 4-/5    Left Hip Extension 3-/5    Left Hip ABduction 4-/5      Flexibility   Soft Tissue Assessment /Muscle Length --   Mod restriciton in LT hamstring flexibility      Palpation   Palpation comment Mod TTP about LT ischial tuberosity, hamstring origin       Transfers   Transfers Sit to Stand    Sit to Stand 6: Modified independent (Device/Increase time)   Weight shift to RT, heavy use of UEs, LLE extended      Ambulation/Gait   Ambulation/Gait Yes    Ambulation/Gait Assistance 6: Modified independent (Device/Increase time);5: Supervision    Assistive device None    Gait Pattern Step-to pattern;Decreased step length - left;Decreased stance time - left;Decreased stride length;Decreased weight shift to left;Antalgic    Ambulation Surface Level;Indoor                      Objective measurements completed on examination: See above findings.               PT Education - 07/24/19 0914    Education Details on evaluation findings, POC and HEP    Person(s) Educated Patient    Methods Explanation;Handout    Comprehension Verbalized understanding            PT Short Term Goals - 07/24/19 1058      PT SHORT TERM GOAL #1   Title Patient will be independent with initial HEP and self-management strategies to improve functional outcomes    Time 2    Period Weeks    Status New    Target Date 08/10/19             PT Long Term Goals - 07/24/19 1058      PT LONG TERM GOAL #1   Title Patient will improve FOTO score by 20% to indicate improvement in functional outcomes    Time 4    Period Weeks    Status New    Target Date 08/24/19      PT LONG TERM GOAL #2   Title Patient will be able to perform stand x 5 in < 15 seconds to demonstrate  improvement in functional mobility and reduced risk for falls.    Time 4    Period Weeks    Status New    Target Date 08/24/19      PT LONG TERM GOAL #3   Title Patient will report at least 70%  overall improvement in subjective complaint to indicate improvement in ability to perform ADLs.    Time 4    Period Weeks    Status New    Target Date 08/24/19      PT LONG TERM GOAL #4   Title Patient will be able to ambulate at least 300 feet during 2MWT with no AD to demonstrate improved ability to perform functional mobility and associated tasks.    Time 4    Period Weeks    Status New    Target Date 08/24/19                  Plan - 07/24/19 1003    Clinical Impression Statement Patient is a 64 y.o. male who presents to physical therapy with complaint of LT leg pain s/p LT hamstring injury on 06/29/19. Patient demonstrates decreased strength, increased tenderness to palpation, flexibility restrictions and gait abnormalities which are likely contributing to symptoms of pain and are negatively impacting patient ability to perform ADLs and functional mobility tasks. Patient will benefit from skilled physical therapy services to address these deficits to reduce pain, and improve level of function with ADLs, and functional mobility tasks    Examination-Activity Limitations Lift;Stand;Toileting;Locomotion Level;Bend;Transfers;Stairs;Squat;Sit    Examination-Participation Restrictions Yard Work;Cleaning;Community Activity;Driving    Stability/Clinical Decision Making Stable/Uncomplicated    Clinical Decision Making Low    Rehab Potential Good    PT Frequency 2x / week    PT Duration 4 weeks    PT Treatment/Interventions ADLs/Self Care Home Management;Aquatic Therapy;Electrical Stimulation;Cryotherapy;Biofeedback;Iontophoresis 4mg /ml Dexamethasone;Moist Heat;Traction;Ultrasound;Parrafin;Fluidtherapy;Contrast Bath;Therapeutic exercise;Therapeutic activities;Functional mobility training;Stair training;Gait training;DME Instruction;Neuromuscular re-education;Manual techniques;Cognitive remediation;Patient/family education;Orthotic Fit/Training;Compression bandaging;Manual lymph drainage;Scar  mobilization;Passive range of motion;Dry needling;Energy conservation;Splinting;Taping;Vasopneumatic Device;Spinal Manipulations;Joint Manipulations;Balance training    PT Next Visit Plan Review goals and HEP. Progress table exercise to tolerance, add SLR, SAQs, glute set if able. Add manual SMT to address pain and restriction in LT hip/ glute area    PT Home Exercise Plan 07/24/19: heel slide, quad set    Consulted and Agree with Plan of Care Patient           Patient will benefit from skilled therapeutic intervention in order to improve the following deficits and impairments:  Abnormal gait, Increased fascial restricitons, Pain, Improper body mechanics, Decreased mobility, Decreased activity tolerance, Decreased range of motion, Decreased strength, Hypomobility, Decreased balance, Difficulty walking, Impaired flexibility  Visit Diagnosis: Pain in left leg  Other abnormalities of gait and mobility  Other symptoms and signs involving the musculoskeletal system     Problem List Patient Active Problem List   Diagnosis Date Noted  . Derangement of posterior horn of medial meniscus 02/01/2013  . Back pain without radiation 12/20/2012  . Osteoarthritis of right knee 12/20/2012  . Left ankle pain 12/20/2012  . Medial meniscus, posterior horn derangement 12/20/2012  . Knee pain 12/20/2012  . Fractured great toe 06/26/2012  . Constipation 06/26/2012   11:02 AM, 07/24/19 Georges Lynchameron Laronda Lisby PT DPT  Physical Therapist with [place]  [facility]  [phone]   [facility] [address] [place], [place], [address] Phone: [phone]   Fax:  [phone]  Name: Erik Wright  MRN: [id number] Date of Birth: 08-08-1955

## 2019-07-26 ENCOUNTER — Other Ambulatory Visit: Payer: Self-pay

## 2019-07-26 ENCOUNTER — Encounter (HOSPITAL_COMMUNITY): Payer: Self-pay | Admitting: Physical Therapy

## 2019-07-26 ENCOUNTER — Ambulatory Visit (HOSPITAL_COMMUNITY): Payer: Federal, State, Local not specified - PPO | Admitting: Physical Therapy

## 2019-07-26 DIAGNOSIS — M79605 Pain in left leg: Secondary | ICD-10-CM

## 2019-07-26 DIAGNOSIS — R29898 Other symptoms and signs involving the musculoskeletal system: Secondary | ICD-10-CM | POA: Diagnosis not present

## 2019-07-26 DIAGNOSIS — R2689 Other abnormalities of gait and mobility: Secondary | ICD-10-CM

## 2019-07-26 NOTE — Therapy (Signed)
[facility] [address] [place], [place], [address] Phone: [phone]   Fax:  [phone]  Physical Therapy Treatment  Patient Details  Name: Erik Wright MRN: [id number] Date of Birth: [birth year] Referring Provider (PT): Darreld Mclean MD    Encounter Date: 07/26/2019   PT End of Session - 07/26/19 0902    Visit Number 2    Number of Visits 8    Date for PT Re-Evaluation 08/24/19    Authorization Type BCBS Federal EMP PPO (25 visit limit combined)    Authorization - Visit Number 2    Authorization - Number of Visits 25    Progress Note Due on Visit 8    PT Start Time 0902    PT Stop Time 0945    PT Time Calculation (min) 43 min    Activity Tolerance Patient tolerated treatment well    Behavior During Therapy [facility] for tasks assessed/performed           Past Medical History:  Diagnosis Date  . Anxiety   . Chronic venous insufficiency   . History of gout   . Hypercholesteremia   . Hypertension     Past Surgical History:  Procedure Laterality Date  . BACK SURGERY     lumbar disc  . KNEE ARTHROSCOPY WITH MEDIAL MENISECTOMY Right 02/09/2013   Procedure: KNEE ARTHROSCOPY WITH MEDIAL MENISECTOMY with limited debridement;  Surgeon: Vickki Hearing, MD;  Location: [facility];  Service: Orthopedics;  Laterality: Right;  . LAPAROSCOPIC GASTRIC BANDING      There were no vitals filed for this visit.   Subjective Assessment - 07/26/19 0902    Subjective Patient states he feels like he has a golf ball in his left butt cheek. Exercises are going well.    Limitations Sitting;House hold activities;Lifting;Standing;Walking    How long can you sit comfortably? 5 minutes    How long can you stand comfortably? Has not tried    How long can you walk comfortably? Has not tried    Diagnostic tests Xrays    Patient Stated Goals Get my legs back, fix this muscle    Currently in Pain? Yes    Pain Score 4     Pain Location Leg    Pain  Orientation Left    Pain Onset 1 to 4 weeks ago                             [facility] Adult PT Treatment/Exercise - 07/26/19 0001      Exercises   Exercises Knee/Hip      Knee/Hip Exercises: Standing   Hip Abduction 2 sets;15 reps;Both      Knee/Hip Exercises: Seated   Long Arc Quad 1 set;10 reps;Left    Long Arc Quad Limitations 5 second holds      Knee/Hip Exercises: Supine   Quad Sets 1 set;5 reps    [employer] Limitations 10 second holds    Short Arc [employer] 1 set;10 reps    Short Arc Quad Sets Limitations 5 second holds    Heel Slides 2 sets;10 reps    Bridges 2 sets;10 reps    Straight Leg Raises 2 sets;10 reps    Other Supine Knee/Hip Exercises glute sets 10x 5 second holds; gentle hamstring iso metrics 10x 5 second holds    Other Supine Knee/Hip Exercises ab sets with marches 2x10 bilateral  PT Education - 07/26/19 0902    Education Details Educated on HEP, exercise mechanics, LE anatomy    Person(s) Educated Patient    Methods Explanation;Demonstration    Comprehension Verbalized understanding;Returned demonstration            PT Short Term Goals - 07/26/19 0908      PT SHORT TERM GOAL #1   Title Patient will be independent with initial HEP and self-management strategies to improve functional outcomes    Time 2    Period Weeks    Status On-going    Target Date 08/10/19             PT Long Term Goals - 07/26/19 0909      PT LONG TERM GOAL #1   Title Patient will improve FOTO score by 20% to indicate improvement in functional outcomes    Time 4    Period Weeks    Status On-going      PT LONG TERM GOAL #2   Title Patient will be able to perform stand x 5 in < 15 seconds to demonstrate improvement in functional mobility and reduced risk for falls.    Time 4    Period Weeks    Status On-going      PT LONG TERM GOAL #3   Title Patient will report at least 70% overall improvement in subjective complaint to  indicate improvement in ability to perform ADLs.    Time 4    Period Weeks    Status On-going      PT LONG TERM GOAL #4   Title Patient will be able to ambulate at least 300 feet during with no AD to demonstrate improved ability to perform functional mobility and associated tasks.    Time 4    Period Weeks    Status On-going                 Plan - 07/26/19 8416    Clinical Impression Statement Reviewed goals and HEP with patient and he is able to complete HEP with proper mechanics following cueing for exercise. Patient able to complete SLR to about 45 degrees of hip flexion with minimal increase in symptoms. Patient has c/o low back pain with glute sets which decreases with slight lift off table. Patient able to complete bridge exercise with prior glute contraction with good mechanics. Patient fatigues quickly in hip musculature with hip abduction exercise and requires verbal cueing for posture. Patient tolerates gentle hamstring isometrics without c/o symptoms. Patient will continue to benefit from skilled physical therapy in order to reduce impairment and improve function.    Examination-Activity Limitations Lift;Stand;Toileting;Locomotion Level;Bend;Transfers;Stairs;Squat;Sit    Examination-Participation Restrictions Yard Work;Cleaning;Community Activity;Driving    Stability/Clinical Decision Making Stable/Uncomplicated    Rehab Potential Good    PT Frequency 2x / week    PT Duration 4 weeks    PT Treatment/Interventions ADLs/Self Care Home Management;Aquatic Therapy;Electrical Stimulation;Cryotherapy;Biofeedback;Iontophoresis 4mg /ml Dexamethasone;Moist Heat;Traction;Ultrasound;Parrafin;Fluidtherapy;Contrast Bath;Therapeutic exercise;Therapeutic activities;Functional mobility training;Stair training;Gait training;DME Instruction;Neuromuscular re-education;Manual techniques;Cognitive remediation;Patient/family education;Orthotic Fit/Training;Compression bandaging;Manual lymph  drainage;Scar mobilization;Passive range of motion;Dry needling;Energy conservation;Splinting;Taping;Vasopneumatic Device;Spinal Manipulations;Joint Manipulations;Balance training    PT Next Visit Plan Progress exercise to tolerance, continue hip, core, and quad strengtheing  Add manual SMT to address pain and restriction in LT hip/ glute area    PT Home Exercise Plan 07/24/19: heel slide, quad set 7/22 SLR, bridge, standing hip abduction    Consulted and Agree with Plan of Care Patient  Patient will benefit from skilled therapeutic intervention in order to improve the following deficits and impairments:  Abnormal gait, Increased fascial restricitons, Pain, Improper body mechanics, Decreased mobility, Decreased activity tolerance, Decreased range of motion, Decreased strength, Hypomobility, Decreased balance, Difficulty walking, Impaired flexibility  Visit Diagnosis: Pain in left leg  Other abnormalities of gait and mobility  Other symptoms and signs involving the musculoskeletal system     Problem List Patient Active Problem List   Diagnosis Date Noted  . Derangement of posterior horn of medial meniscus 02/01/2013  . Back pain without radiation 12/20/2012  . Osteoarthritis of right knee 12/20/2012  . Left ankle pain 12/20/2012  . Medial meniscus, posterior horn derangement 12/20/2012  . Knee pain 12/20/2012  . Fractured great toe 06/26/2012  . Constipation 06/26/2012    9:59 AM, 07/26/19 Wyman Songster PT, DPT Physical Therapist at [facility]  [facility] [address] [place], [place], [address] Phone: [phone]   Fax:  [phone]  Name: Erik Wright MRN: [id number] Date of Birth: 08/23/1955

## 2019-07-26 NOTE — Patient Instructions (Signed)
Access Code: [email or web address] URL: [email or web address] Date: 07/26/2019 Prepared by: Greig Castilla Luiz Trumpower  Exercises Supine Bridge - 1 x daily - 7 x weekly - 2 sets - 10 reps Supine Straight Leg Raises - 1 x daily - 7 x weekly - 2 sets - 10 reps Standing Hip Abduction with Counter Support - 1 x daily - 7 x weekly - 2 sets - 15 reps

## 2019-08-01 ENCOUNTER — Telehealth (HOSPITAL_COMMUNITY): Payer: Self-pay | Admitting: Physical Therapy

## 2019-08-01 NOTE — Telephone Encounter (Signed)
Pt called stating he has home health for PT and wants to be d/

## 2019-08-02 ENCOUNTER — Ambulatory Visit: Payer: Federal, State, Local not specified - PPO | Admitting: Orthopaedic Surgery

## 2019-08-06 ENCOUNTER — Encounter (HOSPITAL_COMMUNITY): Payer: Federal, State, Local not specified - PPO | Admitting: Physical Therapy

## 2019-08-06 DIAGNOSIS — Z6837 Body mass index (BMI) 37.0-37.9, adult: Secondary | ICD-10-CM | POA: Diagnosis not present

## 2019-08-06 DIAGNOSIS — M4306 Spondylolysis, lumbar region: Secondary | ICD-10-CM | POA: Diagnosis not present

## 2019-08-07 ENCOUNTER — Ambulatory Visit (INDEPENDENT_AMBULATORY_CARE_PROVIDER_SITE_OTHER): Payer: Federal, State, Local not specified - PPO | Admitting: Orthopaedic Surgery

## 2019-08-07 ENCOUNTER — Other Ambulatory Visit: Payer: Self-pay

## 2019-08-07 ENCOUNTER — Encounter: Payer: Self-pay | Admitting: Orthopaedic Surgery

## 2019-08-07 VITALS — BP 136/86 | HR 72 | Ht 70.0 in | Wt 262.0 lb

## 2019-08-07 DIAGNOSIS — S76302D Unspecified injury of muscle, fascia and tendon of the posterior muscle group at thigh level, left thigh, subsequent encounter: Secondary | ICD-10-CM | POA: Diagnosis not present

## 2019-08-07 DIAGNOSIS — F17210 Nicotine dependence, cigarettes, uncomplicated: Secondary | ICD-10-CM

## 2019-08-07 MED ORDER — OXYCODONE-ACETAMINOPHEN 5-325 MG PO TABS: 1.0000 | ORAL_TABLET | Freq: Four times a day (QID) | ORAL | 0 refills | Status: AC | PRN

## 2019-08-07 NOTE — Progress Notes (Signed)
Patient Erik Wright, male DOB:10-15-1955, 64 y.o. OZD:[id number]  Chief Complaint  Patient presents with   Leg Injury    left hamstring/ feels better     HPI  Erik Wright is a 64 y.o. male who has hamstring strain on the left.  He has been going to PT.  I have reviewed his PT notes.  He is slowly improving.  He has more pain at night.  I will refill his pain medicine. He knows this will take a while to resolve.   Body mass index is 37.59 kg/m.  ROS  Review of Systems  Constitutional: Positive for activity change.  Musculoskeletal: Positive for arthralgias, gait problem and joint swelling.  All other systems reviewed and are negative.   All other systems reviewed and are negative.  The following is a summary of the past history medically, past history surgically, known current medicines, social history and family history.  This information is gathered electronically by the computer from prior information and documentation.  I review this each visit and have found including this information at this point in the chart is beneficial and informative.    Past Medical History:  Diagnosis Date   Anxiety    Chronic venous insufficiency    History of gout    Hypercholesteremia    Hypertension     Past Surgical History:  Procedure Laterality Date   BACK SURGERY     lumbar disc   KNEE ARTHROSCOPY WITH MEDIAL MENISECTOMY Right 02/09/2013   Procedure: KNEE ARTHROSCOPY WITH MEDIAL MENISECTOMY with limited debridement;  Surgeon: Vickki Hearing, MD;  Location: [facility];  Service: Orthopedics;  Laterality: Right;   LAPAROSCOPIC GASTRIC BANDING      History reviewed. No pertinent family history.  Social History Social History   Tobacco Use   Smoking status: Former Smoker    Packs/day: 1.00    Years: 35.00    Pack years: 35.00    Types: Cigarettes    Quit date: 12/24/2012    Years since quitting: 6.6   Smokeless tobacco: Never Used  Substance Use Topics    Alcohol use: Yes    Comment: Occ   Drug use: No    No Known Allergies  Current Outpatient Medications  Medication Sig Dispense Refill   chlorthalidone (HYGROTON) 25 MG tablet Take 12.5 mg by mouth every morning.      hydrOXYzine (VISTARIL) 25 MG capsule Take 25 mg by mouth 3 (three) times daily as needed. For itching     ibuprofen (ADVIL,MOTRIN) 800 MG tablet Take 1 tablet (800 mg total) by mouth every 8 (eight) hours as needed. 90 tablet 5   naproxen (NAPROSYN) 500 MG tablet Take 1 tablet (500 mg total) by mouth 2 (two) times daily with a meal. 60 tablet 5   oxymetazoline (AFRIN) 0.05 % nasal spray Place 1 spray into both nostrils 2 (two) times daily as needed for congestion.     PARoxetine (PAXIL-CR) 25 MG 24 hr tablet Take 25 mg by mouth every morning.      simvastatin (ZOCOR) 20 MG tablet Take 20 mg by mouth daily.     methocarbamol (ROBAXIN) 500 MG tablet Take 1 tablet (500 mg total) by mouth 2 (two) times daily. (Patient not taking: Reported on 08/07/2019) 30 tablet 0   oxyCODONE-acetaminophen (PERCOCET/ROXICET) 5-325 MG tablet Take 1 tablet by mouth every 6 (six) hours as needed for up to 14 days for severe pain. One tablet every six hours for pain. 14 day limit  56 tablet 0   No current facility-administered medications for this visit.     Physical Exam  Blood pressure 136/86, pulse 72, height 5\' 10"  (1.778 m), weight 262 lb (118.8 kg).  Constitutional: overall normal hygiene, normal nutrition, well developed, normal grooming, normal body habitus. Assistive device:none  Musculoskeletal: gait and station Limp left, muscle tone and strength are normal, no tremors or atrophy is present.  .  Neurological: coordination overall normal.  Deep tendon reflex/nerve stretch intact.  Sensation normal.  Cranial nerves II-XII intact.   Skin:   Normal overall no scars, lesions, ulcers or rashes. No psoriasis.  Psychiatric: Alert and oriented x 3.  Recent memory intact, remote  memory unclear.  Normal mood and affect. Well groomed.  Good eye contact.  Cardiovascular: overall no swelling, no varicosities, no edema bilaterally, normal temperatures of the legs and arms, no clubbing, cyanosis and good capillary refill.  Lymphatic: palpation is normal.  He is very tender over the left hamstring origin on the left pelvis area.  NV intact.  Gait is less of a limp today.  His pain is less.  All other systems reviewed and are negative   The patient has been educated about the nature of the problem(s) and counseled on treatment options.  The patient appeared to understand what I have discussed and is in agreement with it.  Encounter Diagnoses  Name Primary?   Left hamstring injury, subsequent encounter Yes   Nicotine dependence, cigarettes, uncomplicated     PLAN Call if any problems.  Precautions discussed.  Continue current medications.   Return to clinic 2 weeks   Continue PT.  I have reviewed the Controlled Substance Reporting System web site prior to prescribing narcotic medicine for this patient.   Electronically Signed [place], MD 8/3/20218:33 AM

## 2019-08-08 ENCOUNTER — Encounter (HOSPITAL_COMMUNITY): Payer: Federal, State, Local not specified - PPO

## 2019-08-13 ENCOUNTER — Encounter (HOSPITAL_COMMUNITY): Payer: Federal, State, Local not specified - PPO | Admitting: Physical Therapy

## 2019-08-16 ENCOUNTER — Encounter (HOSPITAL_COMMUNITY): Payer: Federal, State, Local not specified - PPO

## 2019-08-20 ENCOUNTER — Encounter (HOSPITAL_COMMUNITY): Payer: Federal, State, Local not specified - PPO | Admitting: Physical Therapy

## 2019-08-21 ENCOUNTER — Ambulatory Visit: Payer: Federal, State, Local not specified - PPO | Admitting: Orthopaedic Surgery

## 2019-08-23 ENCOUNTER — Encounter (HOSPITAL_COMMUNITY): Payer: Federal, State, Local not specified - PPO

## 2019-09-18 ENCOUNTER — Ambulatory Visit (INDEPENDENT_AMBULATORY_CARE_PROVIDER_SITE_OTHER): Payer: Federal, State, Local not specified - PPO | Admitting: Orthopaedic Surgery

## 2019-09-18 ENCOUNTER — Encounter: Payer: Self-pay | Admitting: Orthopaedic Surgery

## 2019-09-18 ENCOUNTER — Other Ambulatory Visit: Payer: Self-pay

## 2019-09-18 VITALS — BP 131/89 | HR 80 | Ht 70.0 in | Wt 271.0 lb

## 2019-09-18 DIAGNOSIS — M25561 Pain in right knee: Secondary | ICD-10-CM

## 2019-09-18 DIAGNOSIS — G8929 Other chronic pain: Secondary | ICD-10-CM | POA: Diagnosis not present

## 2019-09-18 DIAGNOSIS — S76302D Unspecified injury of muscle, fascia and tendon of the posterior muscle group at thigh level, left thigh, subsequent encounter: Secondary | ICD-10-CM | POA: Diagnosis not present

## 2019-09-18 MED ORDER — OXYCODONE-ACETAMINOPHEN 5-325 MG PO TABS: ORAL_TABLET | ORAL | 0 refills | Status: DC

## 2019-09-18 NOTE — Progress Notes (Signed)
Patient Erik Wright:IRCV TYQWAN PINK, male DOB:03-02-1955, 64 y.o. ELF:[id number]  Chief Complaint  Patient presents with  . Leg Pain  . Knee Pain    HPI  Erik Wright is a 64 y.o. male who is recovering from left proximal hamstring injury. He is much better but still has a little pain at times.  His right knee is worse.  He has swelling and popping but no giving way, no redness.     Body mass index is 38.88 kg/m.  ROS  Review of Systems  Constitutional: Positive for activity change.  Musculoskeletal: Positive for arthralgias, gait problem and joint swelling.  All other systems reviewed and are negative.   All other systems reviewed and are negative.  The following is a summary of the past history medically, past history surgically, known current medicines, social history and family history.  This information is gathered electronically by the computer from prior information and documentation.  I review this each visit and have found including this information at this point in the chart is beneficial and informative.    Past Medical History:  Diagnosis Date  . Anxiety   . Chronic venous insufficiency   . History of gout   . Hypercholesteremia   . Hypertension     Past Surgical History:  Procedure Laterality Date  . BACK SURGERY     lumbar disc  . KNEE ARTHROSCOPY WITH MEDIAL MENISECTOMY Right 02/09/2013   Procedure: KNEE ARTHROSCOPY WITH MEDIAL MENISECTOMY with limited debridement;  Surgeon: Vickki Hearing, MD;  Location: [facility];  Service: Orthopedics;  Laterality: Right;  . LAPAROSCOPIC GASTRIC BANDING      History reviewed. No pertinent family history.  Social History Social History   Tobacco Use  . Smoking status: Former Smoker    Packs/day: 1.00    Years: 35.00    Pack years: 35.00    Types: Cigarettes    Quit date: 12/24/2012    Years since quitting: 6.7  . Smokeless tobacco: Never Used  Substance Use Topics  . Alcohol use: Yes    Comment: Occ  . Drug use:  No    No Known Allergies  Current Outpatient Medications  Medication Sig Dispense Refill  . chlorthalidone (HYGROTON) 25 MG tablet Take 12.5 mg by mouth every morning.     Marland Kitchen PARoxetine (PAXIL-CR) 25 MG 24 hr tablet Take 25 mg by mouth every morning.     . simvastatin (ZOCOR) 20 MG tablet Take 20 mg by mouth daily.    . hydrOXYzine (VISTARIL) 25 MG capsule Take 25 mg by mouth 3 (three) times daily as needed. For itching    . ibuprofen (ADVIL,MOTRIN) 800 MG tablet Take 1 tablet (800 mg total) by mouth every 8 (eight) hours as needed. 90 tablet 5  . methocarbamol (ROBAXIN) 500 MG tablet Take 1 tablet (500 mg total) by mouth 2 (two) times daily. (Patient not taking: Reported on 08/07/2019) 30 tablet 0  . naproxen (NAPROSYN) 500 MG tablet Take 1 tablet (500 mg total) by mouth 2 (two) times daily with a meal. 60 tablet 5  . oxymetazoline (AFRIN) 0.05 % nasal spray Place 1 spray into both nostrils 2 (two) times daily as needed for congestion.     No current facility-administered medications for this visit.     Physical Exam  Blood pressure 131/89, pulse 80, height 5\' 10"  (1.778 m), weight 271 lb (122.9 kg).  Constitutional: overall normal hygiene, normal nutrition, well developed, normal grooming, normal body habitus. Assistive device:none  Musculoskeletal:  gait and station Limp right, muscle tone and strength are normal, no tremors or atrophy is present.  .  Neurological: coordination overall normal.  Deep tendon reflex/nerve stretch intact.  Sensation normal.  Cranial nerves II-XII intact.   Skin:   Normal overall no scars, lesions, ulcers or rashes. No psoriasis.  Psychiatric: Alert and oriented x 3.  Recent memory intact, remote memory unclear.  Normal mood and affect. Well groomed.  Good eye contact.  Cardiovascular: overall no swelling, no varicosities, no edema bilaterally, normal temperatures of the legs and arms, no clubbing, cyanosis and good capillary refill.  Lymphatic:  palpation is normal.  Right knee with effusion, crepitus, ROM 0 to 105, stable, NV intact.  Left hamstring with good ROM.   All other systems reviewed and are negative   The patient has been educated about the nature of the problem(s) and counseled on treatment options.  The patient appeared to understand what I have discussed and is in agreement with it.  Encounter Diagnoses  Name Primary?  . Left hamstring injury, subsequent encounter Yes  . Chronic pain of right knee     PROCEDURE NOTE:  The patient requests injections of the right knee , verbal consent was obtained.  The right knee was prepped appropriately after time out was performed.   Sterile technique was observed and injection of 1 cc of Depo-Medrol 40 mg with several cc's of plain xylocaine. Anesthesia was provided by ethyl chloride and a 20-gauge needle was used to inject the knee area. The injection was tolerated well.  A band aid dressing was applied.  The patient was advised to apply ice later today and tomorrow to the injection sight as needed.  PLAN Call if any problems.  Precautions discussed.  Continue current medications.   Return to clinic prn   I have reviewed the [facility] Controlled Substance Reporting System web site prior to prescribing narcotic medicine for this patient.   Electronically Signed Darreld Mclean, MD 9/14/20218:34 AM

## 2019-09-25 ENCOUNTER — Encounter (HOSPITAL_COMMUNITY): Payer: Self-pay | Admitting: Physical Therapy

## 2019-09-25 NOTE — Therapy (Signed)
[facility] [address] [place], [place], [address] Phone: [phone]   Fax:  [phone]  Patient Details  Name: Erik Wright MRN: [id number] Date of Birth: 15-Jul-1955 Referring Provider:  No ref. provider found  Encounter Date: 09/25/2019  PHYSICAL THERAPY DISCHARGE SUMMARY  Visits from Start of Care: 2  Current functional level related to goals / functional outcomes: Unable to reassess as patient has not returned.   Remaining deficits: Unable to reassess as patient has not returned.      Education / Equipment: HEP  Plan: Patient agrees to discharge.  Patient goals were not met. Patient is being discharged due to not returning since the last visit.  ?????      1:57 PM, 09/25/19 Mearl Latin PT, DPT Physical Therapist at [facility] [place], [place], [address] Phone: [phone]   Fax:  [phone]

## 2019-10-29 DIAGNOSIS — M25561 Pain in right knee: Secondary | ICD-10-CM | POA: Diagnosis not present

## 2019-10-29 DIAGNOSIS — F329 Major depressive disorder, single episode, unspecified: Secondary | ICD-10-CM | POA: Diagnosis not present

## 2020-01-29 DIAGNOSIS — Z79899 Other long term (current) drug therapy: Secondary | ICD-10-CM | POA: Diagnosis not present

## 2020-01-29 DIAGNOSIS — E785 Hyperlipidemia, unspecified: Secondary | ICD-10-CM | POA: Diagnosis not present

## 2020-01-29 DIAGNOSIS — K219 Gastro-esophageal reflux disease without esophagitis: Secondary | ICD-10-CM | POA: Diagnosis not present

## 2020-01-29 DIAGNOSIS — I10 Essential (primary) hypertension: Secondary | ICD-10-CM | POA: Diagnosis not present

## 2020-01-29 DIAGNOSIS — F329 Major depressive disorder, single episode, unspecified: Secondary | ICD-10-CM | POA: Diagnosis not present

## 2020-01-29 DIAGNOSIS — Z125 Encounter for screening for malignant neoplasm of prostate: Secondary | ICD-10-CM | POA: Diagnosis not present

## 2020-01-31 DIAGNOSIS — Z6839 Body mass index (BMI) 39.0-39.9, adult: Secondary | ICD-10-CM | POA: Diagnosis not present

## 2020-01-31 DIAGNOSIS — R7309 Other abnormal glucose: Secondary | ICD-10-CM | POA: Diagnosis not present

## 2020-01-31 DIAGNOSIS — E785 Hyperlipidemia, unspecified: Secondary | ICD-10-CM | POA: Diagnosis not present

## 2020-01-31 DIAGNOSIS — I4510 Unspecified right bundle-branch block: Secondary | ICD-10-CM | POA: Diagnosis not present

## 2020-01-31 DIAGNOSIS — I10 Essential (primary) hypertension: Secondary | ICD-10-CM | POA: Diagnosis not present

## 2020-05-01 DIAGNOSIS — M5136 Other intervertebral disc degeneration, lumbar region: Secondary | ICD-10-CM | POA: Diagnosis not present

## 2020-05-01 DIAGNOSIS — I10 Essential (primary) hypertension: Secondary | ICD-10-CM | POA: Diagnosis not present

## 2020-06-19 ENCOUNTER — Ambulatory Visit: Payer: Federal, State, Local not specified - PPO

## 2020-06-19 ENCOUNTER — Ambulatory Visit: Payer: Federal, State, Local not specified - PPO | Admitting: Orthopaedic Surgery

## 2020-06-19 ENCOUNTER — Encounter: Payer: Self-pay | Admitting: Orthopaedic Surgery

## 2020-06-19 ENCOUNTER — Telehealth: Payer: Self-pay | Admitting: Orthopaedic Surgery

## 2020-06-19 ENCOUNTER — Other Ambulatory Visit: Payer: Self-pay

## 2020-06-19 VITALS — BP 133/90 | HR 76 | Ht 71.0 in | Wt 281.5 lb

## 2020-06-19 DIAGNOSIS — G8929 Other chronic pain: Secondary | ICD-10-CM | POA: Diagnosis not present

## 2020-06-19 DIAGNOSIS — M25561 Pain in right knee: Secondary | ICD-10-CM

## 2020-06-19 DIAGNOSIS — M1711 Unilateral primary osteoarthritis, right knee: Secondary | ICD-10-CM | POA: Diagnosis not present

## 2020-06-19 MED ORDER — HYDROCODONE-ACETAMINOPHEN 5-325 MG PO TABS: ORAL_TABLET | ORAL | 0 refills | Status: DC

## 2020-06-19 MED ORDER — NAPROXEN 500 MG PO TABS: 500.0000 mg | ORAL_TABLET | Freq: Two times a day (BID) | ORAL | 5 refills | Status: DC

## 2020-06-19 NOTE — Telephone Encounter (Signed)
Patient called and states he thought he was going to have more than one RX filled.  Like a RX for Advil   Please call him back and advise.   Thanks,

## 2020-06-19 NOTE — Progress Notes (Signed)
My right knee is worse.  He has long history of right knee pain.  I saw him in May for this last year.  He has medial pain.  He has some swelling.  Recently he has had increasing pain, giving way and great difficulty walking.  He is not on any NSAID.  He has used ice and rubs.  They do not help.  He has no new trauma.  The right lower extremity is examined:  Inspection:  Thigh:  Non-tender and no defects  Knee has swelling 1/2+ effusion.                        Joint tenderness is present                        Patient is tender over the medial joint line  Lower Leg:  Has normal appearance and no tenderness or defects  Ankle:  Non-tender and no defects  Foot:  Non-tender and no defects Range of Motion:  Knee:  Range of motion is: 0-100                        Crepitus is  present  Ankle:  Range of motion is normal. Strength and Tone:  The right lower extremity has normal strength and tone. Stability:  Knee:  The knee has positive medial McMurray.  Ankle:  The ankle is stable.   X-rays were done of the right knee, reported separately.  He has increased medial narrowing compared to X-rays done last year.  Encounter Diagnosis  Name Primary?   Chronic pain of right knee Yes   I am concerned about medial meniscus tear.  I would like to get MRI of the right knee.  Return in one month.  PROCEDURE NOTE:  The patient requests injections of the right knee , verbal consent was obtained.  The right knee was prepped appropriately after time out was performed.   Sterile technique was observed and injection of 1 cc of DepoMedrol 40mg  with several cc's of plain xylocaine. Anesthesia was provided by ethyl chloride and a 20-gauge needle was used to inject the knee area. The injection was tolerated well.  A band aid dressing was applied.  The patient was advised to apply ice later today and tomorrow to the injection sight as needed.  Call if any problem.  Precautions  discussed.  Electronically Signed , MD 6/16/20229:10 AM

## 2020-06-21 ENCOUNTER — Ambulatory Visit
Admission: RE | Admit: 2020-06-21 | Discharge: 2020-06-21 | Disposition: A | Discharge status: Home / Self Care | Payer: Federal, State, Local not specified - PPO | Source: Ambulatory Visit | Attending: Orthopaedic Surgery | Admitting: Orthopaedic Surgery

## 2020-06-21 DIAGNOSIS — M25561 Pain in right knee: Secondary | ICD-10-CM

## 2020-06-21 DIAGNOSIS — G8929 Other chronic pain: Secondary | ICD-10-CM

## 2020-06-24 ENCOUNTER — Ambulatory Visit: Payer: Federal, State, Local not specified - PPO | Admitting: Orthopaedic Surgery

## 2020-06-24 ENCOUNTER — Other Ambulatory Visit: Payer: Self-pay

## 2020-06-24 ENCOUNTER — Other Ambulatory Visit: Payer: Federal, State, Local not specified - PPO

## 2020-06-24 ENCOUNTER — Encounter: Payer: Self-pay | Admitting: Orthopaedic Surgery

## 2020-06-24 VITALS — Ht 71.0 in | Wt 281.0 lb

## 2020-06-24 DIAGNOSIS — M1711 Unilateral primary osteoarthritis, right knee: Secondary | ICD-10-CM | POA: Diagnosis not present

## 2020-06-24 DIAGNOSIS — M23611 Other spontaneous disruption of anterior cruciate ligament of right knee: Secondary | ICD-10-CM

## 2020-06-24 DIAGNOSIS — M2419 Other articular cartilage disorders, other specified site: Secondary | ICD-10-CM | POA: Diagnosis not present

## 2020-06-24 DIAGNOSIS — G8929 Other chronic pain: Secondary | ICD-10-CM

## 2020-06-24 NOTE — Progress Notes (Signed)
My knee is hurting.  His right knee is still painful.  He had MRI of the right knee showing:  IMPRESSION: 1. Severe attenuation of body and posterior horn of the medial meniscus which may reflect prior meniscectomy versus an extensive tear. 2. Tricompartmental cartilage abnormalities as described above most severe in the medial femorotibial compartment. 3. Complete chronic ACL tear. 4. Mild tendinosis of the proximal patellar tendon.   I have explained the findings to him.  He has pain most of the time.  He cannot do things.  I have explained the significance of the ACL tear plus the medial loss of cartilage and bone to bone pain.  I have independently reviewed the MRI.    I spent about 30 minutes talking to him about his problem.  He has lateral calf pain also.  He has had doppler study showing no clot.  Encounter Diagnosis  Name Primary?   Chronic pain of right knee Yes   He would like to discuss options with Dr. Dallas Schimke.  He had arthroscopy of the right knee in 2015 by Dr. Romeo Apple.    I will have him see Dr. Dallas Schimke.  Call if any problem.  Precautions discussed.  Electronically Signed Darreld Mclean, MD 6/21/20223:53 PM

## 2020-06-24 NOTE — Patient Instructions (Signed)
To see Dr. Cairns 

## 2020-06-26 ENCOUNTER — Ambulatory Visit: Payer: Federal, State, Local not specified - PPO | Admitting: Orthopaedic Surgery

## 2020-07-01 ENCOUNTER — Ambulatory Visit (INDEPENDENT_AMBULATORY_CARE_PROVIDER_SITE_OTHER): Payer: Federal, State, Local not specified - PPO | Admitting: Orthopedic Surgery

## 2020-07-01 ENCOUNTER — Encounter: Payer: Self-pay | Admitting: Orthopedic Surgery

## 2020-07-01 ENCOUNTER — Other Ambulatory Visit: Payer: Self-pay

## 2020-07-01 VITALS — BP 140/81 | HR 68 | Ht 71.0 in | Wt 281.0 lb

## 2020-07-01 DIAGNOSIS — M1711 Unilateral primary osteoarthritis, right knee: Secondary | ICD-10-CM | POA: Diagnosis not present

## 2020-07-01 NOTE — Progress Notes (Signed)
New Patient Visit  Assessment: Erik Wright is a 65 y.o. male with the following: Right knee arthritis  Plan: Reviewed radiographs and MRI with patient in clinic.  He has advanced arthritis, most severe in the medial compartment.  There is bone bruising within the medial femoral condyle and the medial tibial plateau.  He has full thickness cartilage loss.  He previously had a knee arthroscopy with Dr. Romeo Apple.  At this point, I do not think he would benefit from another knee arthroscopy and his arthritis has advanced to the point where he might benefit from a knee replacement.  His options are to continue with Naproxen as needed, steroid injections or a referral for knee replacement.  He is aware that I do not do total knee replacement.  I have offered him a referral to [facility], and he stated he would do some research and let me know where he would like a referral sent.  No follow up is needed at this time.   Follow-up: Return if symptoms worsen or fail to improve.  Subjective:  Chief Complaint  Patient presents with   Knee Pain    Rt knee     History of Present Illness: Erik Wright is a 65 y.o. male who presents for evaluation of right knee pain.  He has been followed by Dr. Hilda Lias recently and received an injection a couple of weeks ago.  This helped for a couple of days.  He is taking naproxen, but he cannot tell if this is helping.  Pain is localized to the medial aspect of his knee.  He previously had a right knee arthroscopy with Dr. Romeo Apple several years ago, with some improvement in his symptoms.  His current pain started about a year ago.  He did sustain an injury at that time when he fell and had an injury to his left hamstring.     Review of Systems: No fevers or chills No numbness or tingling No chest pain No shortness of breath No bowel or bladder dysfunction No GI distress No headaches   Medical History:  Past Medical History:  Diagnosis Date    Anxiety    Chronic venous insufficiency    History of gout    Hypercholesteremia    Hypertension     Past Surgical History:  Procedure Laterality Date   BACK SURGERY     lumbar disc   KNEE ARTHROSCOPY WITH MEDIAL MENISECTOMY Right 02/09/2013   Procedure: KNEE ARTHROSCOPY WITH MEDIAL MENISECTOMY with limited debridement;  Surgeon: Vickki Hearing, MD;  Location: [facility];  Service: Orthopedics;  Laterality: Right;   LAPAROSCOPIC GASTRIC BANDING      No family history on file. Social History   Tobacco Use   Smoking status: Every Day    Packs/day: 1.00    Years: 35.00    Pack years: 35.00    Types: Cigarettes    Last attempt to quit: 12/24/2012    Years since quitting: 7.5   Smokeless tobacco: Never  Substance Use Topics   Alcohol use: Yes    Comment: Occ   Drug use: No    Allergies  Allergen Reactions   Hydrocodone-Acetaminophen Nausea Only    Current Meds  Medication Sig   chlorthalidone (HYGROTON) 25 MG tablet Take 12.5 mg by mouth every morning.    ibuprofen (ADVIL,MOTRIN) 800 MG tablet Take 1 tablet (800 mg total) by mouth every 8 (eight) hours as needed.   naproxen (NAPROSYN) 500 MG tablet Take 1 tablet (  500 mg total) by mouth 2 (two) times daily with a meal.   oxyCODONE-acetaminophen (PERCOCET/ROXICET) 5-325 MG tablet One tablet every four hours as need for pain.   oxymetazoline (AFRIN) 0.05 % nasal spray Place 1 spray into both nostrils 2 (two) times daily as needed for congestion.   PARoxetine (PAXIL-CR) 25 MG 24 hr tablet Take 25 mg by mouth every morning.    simvastatin (ZOCOR) 20 MG tablet Take 20 mg by mouth daily.    Objective: BP 140/81   Pulse 68   Ht 5\' 11"  (1.803 m)   Wt 281 lb (127.5 kg)   BMI 39.19 kg/m   Physical Exam:  General: Alert and oriented. No acute distress Gait: Right sided antalgic gait  Right knee healed incisions.  No effusion.  Tenderness along the medial joint line.  Extension just short of neutral. Flexion to beyond 90  degrees.    IMAGING: I personally reviewed images previously obtained in clinic  XR with complete loss of joint space medially.  Small osteophytes  MRI with edema within the medial femoral condyle and medial tibial plateau.  ACL tear.  Medial meniscus is diminutive, evidence of previous meniscectomy   New Medications:  No orders of the defined types were placed in this encounter.     , MD  07/01/2020 8:51 AM

## 2020-07-17 ENCOUNTER — Ambulatory Visit: Payer: Federal, State, Local not specified - PPO | Admitting: Orthopaedic Surgery

## 2020-07-30 DIAGNOSIS — M5136 Other intervertebral disc degeneration, lumbar region: Secondary | ICD-10-CM | POA: Diagnosis not present

## 2020-07-30 DIAGNOSIS — I10 Essential (primary) hypertension: Secondary | ICD-10-CM | POA: Diagnosis not present

## 2021-01-20 ENCOUNTER — Other Ambulatory Visit (HOSPITAL_COMMUNITY): Payer: Self-pay | Admitting: Internal Medicine

## 2021-01-20 ENCOUNTER — Other Ambulatory Visit: Payer: Self-pay | Admitting: Internal Medicine

## 2021-01-20 DIAGNOSIS — R1011 Right upper quadrant pain: Secondary | ICD-10-CM

## 2021-01-20 DIAGNOSIS — R1013 Epigastric pain: Secondary | ICD-10-CM

## 2021-01-22 ENCOUNTER — Ambulatory Visit (HOSPITAL_COMMUNITY)
Admission: RE | Admit: 2021-01-22 | Discharge: 2021-01-22 | Disposition: A | Discharge status: Home / Self Care | Payer: Medicare HMO | Source: Ambulatory Visit | Attending: Internal Medicine | Admitting: Internal Medicine

## 2021-01-22 ENCOUNTER — Other Ambulatory Visit: Payer: Self-pay

## 2021-01-22 DIAGNOSIS — R1011 Right upper quadrant pain: Secondary | ICD-10-CM | POA: Diagnosis not present

## 2021-01-22 DIAGNOSIS — K760 Fatty (change of) liver, not elsewhere classified: Secondary | ICD-10-CM | POA: Diagnosis not present

## 2021-01-22 DIAGNOSIS — R1013 Epigastric pain: Secondary | ICD-10-CM | POA: Insufficient documentation

## 2021-03-23 DIAGNOSIS — Z79899 Other long term (current) drug therapy: Secondary | ICD-10-CM | POA: Diagnosis not present

## 2021-03-23 DIAGNOSIS — M109 Gout, unspecified: Secondary | ICD-10-CM | POA: Diagnosis not present

## 2021-03-23 DIAGNOSIS — F32A Depression, unspecified: Secondary | ICD-10-CM | POA: Diagnosis not present

## 2021-03-23 DIAGNOSIS — F419 Anxiety disorder, unspecified: Secondary | ICD-10-CM | POA: Diagnosis not present

## 2021-03-23 DIAGNOSIS — I10 Essential (primary) hypertension: Secondary | ICD-10-CM | POA: Diagnosis not present

## 2021-03-23 DIAGNOSIS — Z125 Encounter for screening for malignant neoplasm of prostate: Secondary | ICD-10-CM | POA: Diagnosis not present

## 2021-03-23 DIAGNOSIS — E785 Hyperlipidemia, unspecified: Secondary | ICD-10-CM | POA: Diagnosis not present

## 2021-03-23 DIAGNOSIS — R7303 Prediabetes: Secondary | ICD-10-CM | POA: Diagnosis not present

## 2021-03-30 DIAGNOSIS — I10 Essential (primary) hypertension: Secondary | ICD-10-CM | POA: Diagnosis not present

## 2021-03-30 DIAGNOSIS — I4510 Unspecified right bundle-branch block: Secondary | ICD-10-CM | POA: Diagnosis not present

## 2021-03-30 DIAGNOSIS — E785 Hyperlipidemia, unspecified: Secondary | ICD-10-CM | POA: Diagnosis not present

## 2021-03-30 DIAGNOSIS — F325 Major depressive disorder, single episode, in full remission: Secondary | ICD-10-CM | POA: Diagnosis not present

## 2021-03-30 DIAGNOSIS — Z0001 Encounter for general adult medical examination with abnormal findings: Secondary | ICD-10-CM | POA: Diagnosis not present

## 2021-03-30 DIAGNOSIS — Z6839 Body mass index (BMI) 39.0-39.9, adult: Secondary | ICD-10-CM | POA: Diagnosis not present

## 2021-03-30 DIAGNOSIS — Z23 Encounter for immunization: Secondary | ICD-10-CM | POA: Diagnosis not present

## 2021-03-31 ENCOUNTER — Other Ambulatory Visit (HOSPITAL_COMMUNITY): Payer: Self-pay | Admitting: Internal Medicine

## 2021-03-31 DIAGNOSIS — F17200 Nicotine dependence, unspecified, uncomplicated: Secondary | ICD-10-CM

## 2021-04-17 DIAGNOSIS — S7012XA Contusion of left thigh, initial encounter: Secondary | ICD-10-CM | POA: Diagnosis not present

## 2021-04-17 DIAGNOSIS — R200 Anesthesia of skin: Secondary | ICD-10-CM | POA: Diagnosis not present

## 2021-04-17 DIAGNOSIS — M79652 Pain in left thigh: Secondary | ICD-10-CM | POA: Diagnosis not present

## 2021-04-17 DIAGNOSIS — M25562 Pain in left knee: Secondary | ICD-10-CM | POA: Diagnosis not present

## 2021-04-17 DIAGNOSIS — Y33XXXA Other specified events, undetermined intent, initial encounter: Secondary | ICD-10-CM | POA: Diagnosis not present

## 2021-05-06 ENCOUNTER — Encounter (HOSPITAL_COMMUNITY): Payer: Self-pay

## 2021-05-06 ENCOUNTER — Ambulatory Visit (HOSPITAL_COMMUNITY): Admission: RE | Admit: 2021-05-06 | Payer: Medicare HMO | Source: Ambulatory Visit

## 2021-05-20 DIAGNOSIS — M25561 Pain in right knee: Secondary | ICD-10-CM | POA: Diagnosis not present

## 2021-06-09 DIAGNOSIS — H5213 Myopia, bilateral: Secondary | ICD-10-CM | POA: Diagnosis not present

## 2021-06-11 DIAGNOSIS — H524 Presbyopia: Secondary | ICD-10-CM | POA: Diagnosis not present

## 2021-07-08 ENCOUNTER — Encounter (HOSPITAL_COMMUNITY): Payer: Medicare HMO

## 2021-07-21 ENCOUNTER — Ambulatory Visit: Admit: 2021-07-21 | Payer: Medicare HMO | Admitting: Orthopedic Surgery

## 2021-07-21 SURGERY — ARTHROPLASTY, KNEE, TOTAL
Anesthesia: Spinal | Site: Knee | Laterality: Right

## 2021-08-26 DIAGNOSIS — H18821 Corneal disorder due to contact lens, right eye: Secondary | ICD-10-CM | POA: Diagnosis not present

## 2021-09-03 DIAGNOSIS — H18821 Corneal disorder due to contact lens, right eye: Secondary | ICD-10-CM | POA: Diagnosis not present

## 2021-12-23 DIAGNOSIS — M25561 Pain in right knee: Secondary | ICD-10-CM | POA: Diagnosis not present

## 2022-01-22 ENCOUNTER — Encounter (HOSPITAL_COMMUNITY): Admission: RE | Admit: 2022-01-22 | Payer: Medicare HMO | Source: Ambulatory Visit

## 2022-02-01 DIAGNOSIS — M5416 Radiculopathy, lumbar region: Secondary | ICD-10-CM | POA: Diagnosis not present

## 2022-02-02 ENCOUNTER — Ambulatory Visit: Admit: 2022-02-02 | Payer: Medicare HMO | Admitting: Orthopedic Surgery

## 2022-02-02 SURGERY — ARTHROPLASTY, KNEE, TOTAL
Anesthesia: Spinal | Site: Knee | Laterality: Right

## 2022-03-25 DIAGNOSIS — K760 Fatty (change of) liver, not elsewhere classified: Secondary | ICD-10-CM | POA: Diagnosis not present

## 2022-03-25 DIAGNOSIS — F329 Major depressive disorder, single episode, unspecified: Secondary | ICD-10-CM | POA: Diagnosis not present

## 2022-03-25 DIAGNOSIS — R7301 Impaired fasting glucose: Secondary | ICD-10-CM | POA: Diagnosis not present

## 2022-03-25 DIAGNOSIS — D751 Secondary polycythemia: Secondary | ICD-10-CM | POA: Diagnosis not present

## 2022-03-25 DIAGNOSIS — I4510 Unspecified right bundle-branch block: Secondary | ICD-10-CM | POA: Diagnosis not present

## 2022-03-25 DIAGNOSIS — M1000 Idiopathic gout, unspecified site: Secondary | ICD-10-CM | POA: Diagnosis not present

## 2022-03-25 DIAGNOSIS — I872 Venous insufficiency (chronic) (peripheral): Secondary | ICD-10-CM | POA: Diagnosis not present

## 2022-03-25 DIAGNOSIS — I10 Essential (primary) hypertension: Secondary | ICD-10-CM | POA: Diagnosis not present

## 2022-03-25 DIAGNOSIS — M1990 Unspecified osteoarthritis, unspecified site: Secondary | ICD-10-CM | POA: Diagnosis not present

## 2022-03-25 DIAGNOSIS — E785 Hyperlipidemia, unspecified: Secondary | ICD-10-CM | POA: Diagnosis not present

## 2022-03-25 DIAGNOSIS — E6609 Other obesity due to excess calories: Secondary | ICD-10-CM | POA: Diagnosis not present

## 2022-03-25 DIAGNOSIS — D45 Polycythemia vera: Secondary | ICD-10-CM | POA: Diagnosis not present

## 2022-04-01 ENCOUNTER — Other Ambulatory Visit (HOSPITAL_COMMUNITY): Payer: Self-pay | Admitting: Internal Medicine

## 2022-04-01 DIAGNOSIS — E785 Hyperlipidemia, unspecified: Secondary | ICD-10-CM | POA: Diagnosis not present

## 2022-04-01 DIAGNOSIS — I10 Essential (primary) hypertension: Secondary | ICD-10-CM | POA: Diagnosis not present

## 2022-04-01 DIAGNOSIS — I4510 Unspecified right bundle-branch block: Secondary | ICD-10-CM | POA: Diagnosis not present

## 2022-04-01 DIAGNOSIS — K760 Fatty (change of) liver, not elsewhere classified: Secondary | ICD-10-CM | POA: Diagnosis not present

## 2022-04-01 DIAGNOSIS — R32 Unspecified urinary incontinence: Secondary | ICD-10-CM | POA: Diagnosis not present

## 2022-04-01 DIAGNOSIS — F17200 Nicotine dependence, unspecified, uncomplicated: Secondary | ICD-10-CM

## 2022-05-24 ENCOUNTER — Ambulatory Visit (HOSPITAL_COMMUNITY)
Admission: RE | Admit: 2022-05-24 | Discharge: 2022-05-24 | Disposition: A | Discharge status: Home / Self Care | Payer: Medicare HMO | Source: Ambulatory Visit | Attending: Internal Medicine | Admitting: Internal Medicine

## 2022-05-24 DIAGNOSIS — F17200 Nicotine dependence, unspecified, uncomplicated: Secondary | ICD-10-CM

## 2022-05-24 DIAGNOSIS — Z122 Encounter for screening for malignant neoplasm of respiratory organs: Secondary | ICD-10-CM | POA: Diagnosis not present

## 2022-05-24 DIAGNOSIS — F17210 Nicotine dependence, cigarettes, uncomplicated: Secondary | ICD-10-CM | POA: Diagnosis not present

## 2022-07-10 ENCOUNTER — Other Ambulatory Visit: Payer: Self-pay

## 2022-07-10 ENCOUNTER — Emergency Department (HOSPITAL_COMMUNITY): Payer: Medicare HMO

## 2022-07-10 ENCOUNTER — Encounter (HOSPITAL_COMMUNITY): Payer: Self-pay | Admitting: Emergency Medicine

## 2022-07-10 ENCOUNTER — Emergency Department (HOSPITAL_COMMUNITY)
Admission: EM | Admit: 2022-07-10 | Discharge: 2022-07-10 | Disposition: A | Discharge status: Home / Self Care | Payer: Medicare HMO | Attending: Emergency Medicine | Admitting: Emergency Medicine

## 2022-07-10 DIAGNOSIS — K859 Acute pancreatitis without necrosis or infection, unspecified: Secondary | ICD-10-CM | POA: Insufficient documentation

## 2022-07-10 DIAGNOSIS — I10 Essential (primary) hypertension: Secondary | ICD-10-CM | POA: Diagnosis not present

## 2022-07-10 DIAGNOSIS — K828 Other specified diseases of gallbladder: Secondary | ICD-10-CM | POA: Diagnosis not present

## 2022-07-10 DIAGNOSIS — K861 Other chronic pancreatitis: Secondary | ICD-10-CM | POA: Diagnosis not present

## 2022-07-10 DIAGNOSIS — R1010 Upper abdominal pain, unspecified: Secondary | ICD-10-CM | POA: Diagnosis present

## 2022-07-10 DIAGNOSIS — R1013 Epigastric pain: Secondary | ICD-10-CM | POA: Diagnosis not present

## 2022-07-10 LAB — COMPREHENSIVE METABOLIC PANEL
ALT: 31 U/L (ref 0–44)
AST: 17 U/L (ref 15–41)
Albumin: 3.9 g/dL (ref 3.5–5.0)
Alkaline Phosphatase: 59 U/L (ref 38–126)
Anion gap: 9 (ref 5–15)
BUN: 20 mg/dL (ref 8–23)
CO2: 25 mmol/L (ref 22–32)
Calcium: 9.2 mg/dL (ref 8.9–10.3)
Chloride: 100 mmol/L (ref 98–111)
Creatinine, Ser: 0.88 mg/dL (ref 0.61–1.24)
GFR, Estimated: >60 mL/min (ref >60)
Glucose, Bld: 113 mg/dL — ABNORMAL HIGH (ref 70–99)
Potassium: 3.5 mmol/L (ref 3.5–5.1)
Sodium: 134 mmol/L — ABNORMAL LOW (ref 135–145)
Total Bilirubin: 0.9 mg/dL (ref 0.3–1.2)
Total Protein: 7.4 g/dL (ref 6.5–8.1)

## 2022-07-10 LAB — CBC WITH DIFFERENTIAL/PLATELET
Abs Immature Granulocytes: 0.03 10*3/uL (ref 0.00–0.07)
Basophils Absolute: 0 10*3/uL (ref 0.0–0.1)
Basophils Relative: 1 %
Eosinophils Absolute: 0.4 10*3/uL (ref 0.0–0.5)
Eosinophils Relative: 4 %
HCT: 50.1 % (ref 39.0–52.0)
Hemoglobin: 17.3 g/dL — ABNORMAL HIGH (ref 13.0–17.0)
Immature Granulocytes: 0 %
Lymphocytes Relative: 17 %
Lymphs Abs: 1.4 10*3/uL (ref 0.7–4.0)
MCH: 30.8 pg (ref 26.0–34.0)
MCHC: 34.5 g/dL (ref 30.0–36.0)
MCV: 89.1 fL (ref 80.0–100.0)
Monocytes Absolute: 0.8 10*3/uL (ref 0.1–1.0)
Monocytes Relative: 9 %
Neutro Abs: 5.9 10*3/uL (ref 1.7–7.7)
Neutrophils Relative %: 69 %
Platelets: 163 10*3/uL (ref 150–400)
RBC: 5.62 MIL/uL (ref 4.22–5.81)
RDW: 13 % (ref 11.5–15.5)
WBC: 8.5 10*3/uL (ref 4.0–10.5)
nRBC: 0 % (ref 0.0–0.2)

## 2022-07-10 LAB — LIPASE, BLOOD: Lipase: 41 U/L (ref 11–51)

## 2022-07-10 MED ORDER — ONDANSETRON HCL 4 MG/2ML IJ SOLN
4.0000 mg | Freq: Once | INTRAMUSCULAR | Status: AC
  Administered 2022-07-10: 4 mg via INTRAVENOUS
  Filled 2022-07-10: qty 2

## 2022-07-10 MED ORDER — IOHEXOL 300 MG/ML  SOLN
100.0000 mL | Freq: Once | INTRAMUSCULAR | Status: AC | PRN
  Administered 2022-07-10: 100 mL via INTRAVENOUS

## 2022-07-10 MED ORDER — FENTANYL CITRATE PF 50 MCG/ML IJ SOSY
50.0000 ug | PREFILLED_SYRINGE | Freq: Once | INTRAMUSCULAR | Status: AC
  Administered 2022-07-10: 50 ug via INTRAVENOUS
  Filled 2022-07-10: qty 1

## 2022-07-10 MED ORDER — OXYCODONE-ACETAMINOPHEN 5-325 MG PO TABS: 1.0000 | ORAL_TABLET | Freq: Four times a day (QID) | ORAL | 0 refills | Status: DC | PRN

## 2022-07-10 MED ORDER — ONDANSETRON 4 MG PO TBDP: 4.0000 mg | ORAL_TABLET | Freq: Three times a day (TID) | ORAL | 0 refills | Status: DC | PRN

## 2022-07-10 NOTE — ED Notes (Signed)
Pt holding down water without nausea Pt ambulated to and from restroom without assistance or difficulty  Vitals assessed and listed

## 2022-07-10 NOTE — ED Provider Notes (Signed)
[place] EMERGENCY DEPARTMENT AT [facility] Provider Note   CSN: [id number] Arrival date & time: 07/10/22  7829     History  Chief Complaint  Patient presents with   Abdominal Pain    Erik Wright is a 67 y.o. male.   Abdominal Pain Patient with onset upper abdominal pain.  Has had since Wednesday.  Worse with eating.  States decreased oral intake because of it.  Has felt dizzy.  Upper abdomen.  States been able to drink some liquid but eating anything makes it worse.  No fevers or chills.  Previous lap band.  Rare alcohol use.  Still has gallbladder.    Past Medical History:  Diagnosis Date   Anxiety    Chronic venous insufficiency    History of gout    Hypercholesteremia    Hypertension    Past Surgical History:  Procedure Laterality Date   BACK SURGERY     lumbar disc   KNEE ARTHROSCOPY WITH MEDIAL MENISECTOMY Right 02/09/2013   Procedure: KNEE ARTHROSCOPY WITH MEDIAL MENISECTOMY with limited debridement;  Surgeon: Vickki Hearing, MD;  Location: [facility];  Service: Orthopedics;  Laterality: Right;   LAPAROSCOPIC GASTRIC BANDING       Home Medications Prior to Admission medications   Medication Sig Start Date End Date Taking? Authorizing Provider  ondansetron (ZOFRAN-ODT) 4 MG disintegrating tablet Take 1 tablet (4 mg total) by mouth every 8 (eight) hours as needed for nausea or vomiting. 07/10/22  Yes Benjiman Core, MD  oxyCODONE-acetaminophen (PERCOCET/ROXICET) 5-325 MG tablet Take 1 tablet by mouth every 6 (six) hours as needed for severe pain. 07/10/22  Yes Benjiman Core, MD  chlorthalidone (HYGROTON) 25 MG tablet Take 12.5 mg by mouth every morning.     [provider]  HYDROcodone-acetaminophen (NORCO/VICODIN) 5-325 MG tablet One tablet every four hours for pain. Patient not taking: Reported on 07/01/2020 06/19/20   Darreld Mclean, MD  hydrOXYzine (VISTARIL) 25 MG capsule Take 25 mg by mouth 3 (three) times daily as needed. For  itching Patient not taking: No sig reported 05/01/13   [provider]  ibuprofen (ADVIL,MOTRIN) 800 MG tablet Take 1 tablet (800 mg total) by mouth every 8 (eight) hours as needed. 09/19/13   Vickki Hearing, MD  methocarbamol (ROBAXIN) 500 MG tablet Take 1 tablet (500 mg total) by mouth 2 (two) times daily. Patient not taking: No sig reported 07/12/19   Darreld Mclean, MD  naproxen (NAPROSYN) 500 MG tablet Take 1 tablet (500 mg total) by mouth 2 (two) times daily with a meal. 06/19/20   Darreld Mclean, MD  oxymetazoline (AFRIN) 0.05 % nasal spray Place 1 spray into both nostrils 2 (two) times daily as needed for congestion.    [provider]  PARoxetine (PAXIL-CR) 25 MG 24 hr tablet Take 25 mg by mouth every morning.     [provider]  simvastatin (ZOCOR) 20 MG tablet Take 20 mg by mouth daily.    [provider]      Allergies    Hydrocodone-acetaminophen    Review of Systems   Review of Systems  Gastrointestinal:  Positive for abdominal pain.    Physical Exam Updated Vital Signs BP (!) 153/95   Pulse 65   Temp 97.6 F (36.4 C) (Oral)   Resp 12   Ht 5\' 11"  (1.803 m)   Wt 124.7 kg   SpO2 96%   BMI 38.35 kg/m  Physical Exam Vitals and nursing note reviewed.  HENT:  Head: Atraumatic.  Cardiovascular:     Rate and Rhythm: Regular rhythm.  Pulmonary:     Effort: Pulmonary effort is normal.  Abdominal:     Tenderness: There is abdominal tenderness.     Comments: Moderate upper abdominal tenderness.  No hernia palpated.  No rebound or guarding.  Skin:    Capillary Refill: Capillary refill takes less than 2 seconds.  Neurological:     Mental Status: He is alert and oriented to person, place, and time.     ED Results / Procedures / Treatments   Labs (all labs ordered are listed, but only abnormal results are displayed) Labs Reviewed  COMPREHENSIVE METABOLIC PANEL - Abnormal; Notable for the following components:      Result  Value   Sodium 134 (*)    Glucose, Bld 113 (*)    All other components within normal limits  CBC WITH DIFFERENTIAL/PLATELET - Abnormal; Notable for the following components:   Hemoglobin 17.3 (*)    All other components within normal limits  LIPASE, BLOOD    EKG None  Radiology CT ABDOMEN PELVIS W CONTRAST  Result Date: 07/10/2022 CLINICAL DATA:  Left upper quadrant abdominal pain. EXAM: CT ABDOMEN AND PELVIS WITH CONTRAST TECHNIQUE: Multidetector CT imaging of the abdomen and pelvis was performed using the standard protocol following bolus administration of intravenous contrast. RADIATION DOSE REDUCTION: This exam was performed according to the departmental dose-optimization program which includes automated exposure control, adjustment of the mA and/or kV according to patient size and/or use of iterative reconstruction technique. CONTRAST:  OMNIPAQUE IOHEXOL 300 MG/ML  SOLN COMPARISON:  Right upper quadrant sonogram 01/22/2021. FINDINGS: Lower chest: No acute abnormality. Hepatobiliary: No focal liver abnormality. Equivocal wall thickening of the gallbladder measuring up to 5 mm, image 38/2. No gallstones identified. No bile duct dilatation. Pancreas: Changes of chronic pancreatitis with multiple parenchymal calcifications identified. Edema with surrounding soft tissue stranding is noted involving the head and uncinate process of the pancreas, image 44/2. No main duct dilatation, mass or focal fluid collection. Spleen: Normal in size without focal abnormality. Adrenals/Urinary Tract: Adrenal glands are unremarkable. Kidneys are normal, without renal calculi, focal lesion, or hydronephrosis. Bladder is unremarkable. Stomach/Bowel: There is a gastric band identified just below the level of the GE junction. This appears to be in appropriate orientation. No pathologic dilatation of the large or small bowel loops. The appendix is visualized and appears normal. No bowel wall thickening or  inflammation identified. Vascular/Lymphatic: Aortic atherosclerosis without aneurysm. No signs of abdominopelvic adenopathy. Reproductive: Prostate is unremarkable. Other: No free fluid or fluid collections identified. No signs of pneumoperitoneum. Bilateral fat containing inguinal hernias. Musculoskeletal: No acute or significant osseous findings. Facet arthropathy noted within the lower lumbar spine. L5-S1 degenerative disc disease. IMPRESSION: 1. Changes of chronic pancreatitis with acute on chronic pancreatitis involving the head and uncinate process of the pancreas. No main duct dilatation, mass or focal fluid collection. 2. Equivocal wall thickening of the gallbladder. No gallstones identified. 3. Gastric band in appropriate orientation. 4. Aortic Atherosclerosis (ICD10-I70.0). Electronically Signed   By: Signa Kell M.D.   On: 07/10/2022 11:23    Procedures Procedures    Medications Ordered in ED Medications  fentaNYL (SUBLIMAZE) injection 50 mcg (50 mcg Intravenous Given 07/10/22 1010)  ondansetron (ZOFRAN) injection 4 mg (4 mg Intravenous Given 07/10/22 1010)  iohexol (OMNIPAQUE) 300 MG/ML solution 100 mL (100 mLs Intravenous Contrast Given 07/10/22 1056)    ED Course/ Medical Decision Making/ A&P  Medical Decision Making Amount and/or Complexity of Data Reviewed Labs: ordered. Radiology: ordered.  Risk Prescription drug management.   Patient upper abdominal pain.  Has had for the last few days.  Worse with eating.  Differential diagnosis includes biliary disease, ulcer, pancreatitis.  Blood work done and overall reassuring.  CT scan will be done.  CT scan shows potential acute on chronic pancreatitis.  Does have some calcific changes of the pancreas.  Previously did have abdominal pain while on a cruise.  States no real issues since.  However this potentially could have been a first cause of the pancreatitis.  Feels better and has tolerated orals here.  I  think he is stable for discharge.  Will have follow-up with gastroenterology for further workup of the pancreatitis.        Final Clinical Impression(s) / ED Diagnoses Final diagnoses:  Acute pancreatitis, unspecified complication status, unspecified pancreatitis type    Rx / DC Orders ED Discharge Orders          Ordered    oxyCODONE-acetaminophen (PERCOCET/ROXICET) 5-325 MG tablet  Every 6 hours PRN        07/10/22 1254    ondansetron (ZOFRAN-ODT) 4 MG disintegrating tablet  Every 8 hours PRN        07/10/22 1254              Benjiman Core, MD 07/10/22 1255

## 2022-07-10 NOTE — ED Notes (Signed)
Pt sitting up on the side of the bed Gave water

## 2022-07-10 NOTE — ED Notes (Signed)
Introduced self to pt Pt stated he has upper ABD pain that started WED PM that radiates through to his back. Pain is worse when eating and drinking Denies N/V/D ABD pain 3/10 HA 3/10 denies blurred vision and dizziness Denies CP and SOB Vitals assessed Waiting on CT results

## 2022-07-10 NOTE — ED Triage Notes (Signed)
Pt reports generalized abdominal pain that radiates to his back. Pt reports this started on Wednesday. Denies N/V/D.

## 2022-07-16 ENCOUNTER — Telehealth: Payer: Self-pay

## 2022-07-16 NOTE — Telephone Encounter (Signed)
Transition Care Management Unsuccessful Follow-up Telephone Call  Date of discharge and from where:  [facility]  Attempts:  1st Attempt  Reason for unsuccessful TCM follow-up call:  No answer/busy   Lenard Forth [facility] Guide, [facility] [phone] [address] [place], [place], [place] [address] Phone: [phone] Email: [email or web address]

## 2022-07-19 ENCOUNTER — Telehealth: Payer: Self-pay

## 2022-07-19 NOTE — Telephone Encounter (Signed)
Transition Care Management Follow-up Telephone Call Date of discharge and from where: [facility] How have you been since you were released from the hospital? Doing just fine  Any questions or concerns? No  Items Reviewed: Did the pt receive and understand the discharge instructions provided? Yes  Medications obtained and verified? Yes  Other? No  Any new allergies since your discharge? No  Dietary orders reviewed? No Do you have support at home? Yes     Follow up appointments reviewed:  PCP Hospital f/u appt confirmed? No  Scheduled to see  on  @ . Specialist Hospital f/u appt confirmed?  Scheduled to see  on  @ . Are transportation arrangements needed? No  If their condition worsens, is the pt aware to call PCP or go to the Emergency Dept.? Yes Was the patient provided with contact information for the PCP's office or ED? Yes Was to pt encouraged to call back with questions or concerns? Yes

## 2022-07-28 NOTE — Progress Notes (Unsigned)
GI Office Note    Referring Provider: Carylon Perches, MD Primary Care Physician:  Carylon Perches, MD  Primary Gastroenterologist: Dr. Tasia Catchings  Chief Complaint   Chief Complaint  Patient presents with   Follow-up    Patient here today for a follow up on his recent visit to [facility] on 07/10/2022 due to acute pancreatitis. Patient denies any current gi issues.    History of Present Illness   Erik Wright is a 67 y.o. male presenting today at the request of Carylon Perches, MD for ED follow up of pancreatitis.   Recent ED visit 07/10/22 for multiple days of upper abdominal pain worse post prandially and decreased p.o. intake. Only able to tolerate liquids. Reported history of lap band. Labs indicated hemoconcentration with Hgb 17.3 secondary to dehydration and hyponatremia with na 134. Lipase wnl. CT as outlined below. Treated with fentanyl and zofran and IV fluids. Given narcotics and zofran on discharge. Not admitted.   CT A/P 07/10/22: -chronic pancreatitis with calcifications and edema with stranding in the head and uncinate process without fluid collection or ductal dilation.  - gastric band just below GE junction -equivocal wall thickening of gallbladder (5mm)  Today:  On a cruise a year ago he admitted to frequent drinking and then went to the ED on cruise ship with epigastric pain. They treated his pain. It took his breath away and told him to stay away from fatty meals.   A few days before ED visit he woke up in his recliner sweating and severe pain 10/10 and all epigastric region and some in his back. 3 days later went to the ED. Felt extreme nausea initially but no vomiting.  When this all first started his wife gave I'm tums and pepcid and that did not help. Does not typically have heartburn. Has bene increasing hydration. Avoiding sugars. Took electrolyte supplementation. Did chicken soup for a few days, still doing that some. Has been avoiding fried foods.   On the July 3rd he did  have a few drinks but does not drink regularly.   Recently had lung cancer screening. Takes stool softener given opioids.   Alcohol: irregular, social Medications: simvastatin (class 1a), chlorthalidone (class 3) Family history: none Tobacco use: 2-3 ciggs per day, many years Weight loss: none Diarrhea: none - takes daily stool softener Recent triglycerides normal in March 2024 Recently normal calcium.   Wt Readings from Last 3 Encounters:  07/29/22 282 lb 8 oz (128.1 kg)  07/10/22 275 lb (124.7 kg)  07/01/20 281 lb (127.5 kg)   Last colonoscopy was possibly with [facility]. Done about 5 years ago. No polyps. Due in about 4-5 years from now.   Current Outpatient Medications  Medication Sig Dispense Refill   chlorthalidone (HYGROTON) 25 MG tablet Take 12.5 mg by mouth every morning.      hydrOXYzine (VISTARIL) 25 MG capsule Take 25 mg by mouth 3 (three) times daily as needed. For itching     oxyCODONE-acetaminophen (PERCOCET/ROXICET) 5-325 MG tablet Take 1 tablet by mouth every 6 (six) hours as needed for severe pain. 8 tablet 0   oxymetazoline (AFRIN) 0.05 % nasal spray Place 1 spray into both nostrils 2 (two) times daily as needed for congestion.     PARoxetine (PAXIL-CR) 25 MG 24 hr tablet Take 25 mg by mouth every morning.      simvastatin (ZOCOR) 20 MG tablet Take 20 mg by mouth daily.     No current facility-administered medications for  this visit.    Past Medical History:  Diagnosis Date   Anxiety    Chronic venous insufficiency    History of gout    Hypercholesteremia    Hypertension     Past Surgical History:  Procedure Laterality Date   BACK SURGERY     lumbar disc   KNEE ARTHROSCOPY WITH MEDIAL MENISECTOMY Right 02/09/2013   Procedure: KNEE ARTHROSCOPY WITH MEDIAL MENISECTOMY with limited debridement;  Surgeon: Vickki Hearing, MD;  Location: [facility];  Service: Orthopedics;  Laterality: Right;   LAPAROSCOPIC GASTRIC BANDING      History reviewed. No pertinent  family history.  Allergies as of 07/29/2022 - Review Complete 07/29/2022  Allergen Reaction Noted   Hydrocodone-acetaminophen Nausea Only 06/24/2020    Social History   Socioeconomic History   Marital status: Married    Spouse name: Not on file   Number of children: Not on file   Years of education: Not on file   Highest education level: Not on file  Occupational History   Not on file  Tobacco Use   Smoking status: Every Day    Current packs/day: 0.00    Average packs/day: 1 pack/day for 35.0 years (35.0 ttl pk-yrs)    Types: Cigarettes    Start date: 12/24/1977    Last attempt to quit: 12/24/2012    Years since quitting: 9.6   Smokeless tobacco: Never  Vaping Use   Vaping status: Never Used  Substance and Sexual Activity   Alcohol use: Yes    Comment: Occ   Drug use: No   Sexual activity: Yes    Birth control/protection: None  Other Topics Concern   Not on file  Social History Narrative   Not on file   Social Determinants of Health   Financial Resource Strain: Not on file  Food Insecurity: Not on file  Transportation Needs: Not on file  Physical Activity: Not on file  Stress: Not on file  Social Connections: Not on file  Intimate Partner Violence: Not on file   Review of Systems   Gen: Denies any fever, chills, fatigue, weight loss, lack of appetite.  CV: Denies chest pain, heart palpitations, peripheral edema, syncope.  Resp: Denies shortness of breath at rest or with exertion. Denies wheezing or cough.  GI: see HPI GU : Denies urinary burning, urinary frequency, urinary hesitancy MS: Denies joint pain, muscle weakness, cramps, or limitation of movement.  Derm: Denies rash, itching, dry skin Psych: Denies depression, anxiety, memory loss, and confusion Heme: Denies bruising, bleeding, and enlarged lymph nodes.  Physical Exam   BP 137/88 (BP Location: Left Arm, Patient Position: Sitting, Cuff Size: Large)   Pulse 78   Temp (!) 97.1 F (36.2 C)  (Temporal)   Ht 5\' 11"  (1.803 m)   Wt 282 lb 8 oz (128.1 kg)   BMI 39.40 kg/m   General:   Alert and oriented. Pleasant and cooperative. Well-nourished and well-developed.  Head:  Normocephalic and atraumatic. Eyes:  Without icterus, sclera clear and conjunctiva pink.  Ears:  Normal auditory acuity. Mouth:  No deformity or lesions, oral mucosa pink.  Lungs:  Clear to auscultation bilaterally. No wheezes, rales, or rhonchi. No distress.  Heart:  S1, S2 present without murmurs appreciated.  Abdomen:  +BS, soft, non-tender and non-distended. No HSM noted. No guarding or rebound. No masses appreciated. Lap band palpated.  Rectal:  Deferred  Msk:  Symmetrical without gross deformities. Normal posture. Extremities:  Without edema. Neurologic:  Alert and  oriented x4;  grossly normal neurologically. Skin:  Intact without significant lesions or rashes. Psych:  Alert and cooperative. Normal mood and affect.  Assessment   Erik Wright is a 67 y.o. male with a history of anxiety, chronic venous insufficiency, gout, HLD, HTN presenting today for further evaluation of chronic pancreatitis.  Pancreatitis: Recent CT imaging revealing evidence of acute on chronic pancreatitis.  Labs with normal lipase.  Evidence of increased hemoglobin, likely hemoconcentration.  Was having significant epigastric pain at the time, symptoms have resolved.  The night that he had severe pain he had had some recent alcohol use and also a year ago on the cruise when he was having epigastric pain he had admitted to frequent drinking but overall in between that he has very rare social drinks.  Calcium and recent triglycerides normal.  Symptoms could be secondary to possible cholelithiasis that has not been called by imaging, alcohol induced, or other medication induced including simvastatin.  Autoimmune is low on the list of differentials however remains possible as well as malignancy.  Given chronic calcifications we will plan  on MRCP to rule out potential malignancy and further assess hepatobiliary ducts and pancreatic ducts. Reassuringly recent LFTs negative. Discussed avoiding alcohol and continuing low fat diet.  Pending MRCP findings we may need to consider changing simvastatin versus monitoring for another recurrence.   Patient has seen [facility] in the past for colonoscopies.  Reports his last one was about 5-6 years ago, possibly age 32.  He states he is not due until around about age 48.  He is unsure if he would like to continue to follow with [place] for colonoscopies in the future but we can continue that discussion in the future.   PLAN   IgG4 MRCP (open if possible due top claustrophobia) Low fat diet Avoid alcohol May need to consider switching statin if all other findings negative.  Follow up in 3 months    Brooke Bonito, MSN, FNP-BC, AGACNP-BC [facility]

## 2022-07-29 ENCOUNTER — Encounter: Payer: Self-pay | Admitting: Gastroenterology

## 2022-07-29 ENCOUNTER — Ambulatory Visit (INDEPENDENT_AMBULATORY_CARE_PROVIDER_SITE_OTHER): Payer: Medicare HMO | Admitting: Gastroenterology

## 2022-07-29 ENCOUNTER — Encounter (INDEPENDENT_AMBULATORY_CARE_PROVIDER_SITE_OTHER): Payer: Self-pay

## 2022-07-29 VITALS — BP 137/88 | HR 78 | Temp 97.1°F | Ht 71.0 in | Wt 282.5 lb

## 2022-07-29 DIAGNOSIS — K861 Other chronic pancreatitis: Secondary | ICD-10-CM | POA: Diagnosis not present

## 2022-07-29 DIAGNOSIS — K859 Acute pancreatitis without necrosis or infection, unspecified: Secondary | ICD-10-CM | POA: Diagnosis not present

## 2022-07-29 NOTE — Patient Instructions (Signed)
Please have blood work completed at [facility].  We will call you with results once they have been received. Please allow 3-5 business days for review. 2 locations for [facility] in [place]:              1. [address], [place]              2. [address] [place]   We will schedule you for an MRI/MRCP to further evaluate your pancreas.  We will work toward getting you scheduled for the open MRI if possible.  Continue to avoid alcohol, NSAIDs, and follow a low-fat diet.  If all testing negative we may need to consider changing your simvastatin to something different as this is high on the list of probable medications that can cause pancreatitis.  Follow up in 3 months. Best of wishes for your wife!  Please let me know if you need anything in the meantime.  It was a pleasure to see you today. I want to create trusting relationships with patients. If you receive a survey regarding your visit,  I greatly appreciate you taking time to fill this out on paper or through your MyChart. I value your feedback.  Brooke Bonito, MSN, FNP-BC, AGACNP-BC [facility]

## 2022-08-02 DIAGNOSIS — K859 Acute pancreatitis without necrosis or infection, unspecified: Secondary | ICD-10-CM | POA: Diagnosis not present

## 2022-08-02 DIAGNOSIS — I10 Essential (primary) hypertension: Secondary | ICD-10-CM | POA: Diagnosis not present

## 2022-08-02 DIAGNOSIS — K861 Other chronic pancreatitis: Secondary | ICD-10-CM | POA: Diagnosis not present

## 2022-08-19 ENCOUNTER — Ambulatory Visit (HOSPITAL_COMMUNITY): Payer: Medicare HMO

## 2022-09-16 ENCOUNTER — Ambulatory Visit (HOSPITAL_COMMUNITY): Payer: Medicare HMO

## 2022-10-12 DIAGNOSIS — Z23 Encounter for immunization: Secondary | ICD-10-CM | POA: Diagnosis not present

## 2022-11-01 ENCOUNTER — Ambulatory Visit: Payer: Medicare HMO | Admitting: Gastroenterology

## 2022-11-01 ENCOUNTER — Encounter: Payer: Self-pay | Admitting: Gastroenterology

## 2022-11-01 NOTE — Progress Notes (Deleted)
GI Office Note    Referring Provider: Carylon Perches, MD Primary Care Physician:  Carylon Perches, MD Primary Gastroenterologist: Dr. Tasia Catchings  Date:  11/01/2022  ID:  Erik Wright, DOB [birth year], MRN [id number]   Chief Complaint   No chief complaint on file.  History of Present Illness  Erik Wright is a 67 y.o. male with a history of *** presenting today with complaint of   Recent ED visit 07/10/22 for multiple days of upper abdominal pain worse post prandially and decreased p.o. intake. Only able to tolerate liquids. Reported history of lap band. Labs indicated hemoconcentration with Hgb 17.3 secondary to dehydration and hyponatremia with na 134. Lipase wnl. CT as outlined below. Treated with fentanyl and zofran and IV fluids. Given narcotics and zofran on discharge. Not admitted.    CT A/P 07/10/22: - chronic pancreatitis with calcifications and edema with stranding in the head and uncinate process without fluid collection or ductal dilation.  - gastric band just below GE junction -equivocal wall thickening of gallbladder (5mm)  Initial office visit 07/29/22. *** IgG4 advised. MRCP advised, advised we would try open MRI if possible.  Advised to avoid alcohol and follow a low-fat diet.  Discussed potentially switching statin.  IgG4 normal.   MRCP not completed   Today:    Current Outpatient Medications  Medication Sig Dispense Refill   chlorthalidone (HYGROTON) 25 MG tablet Take 12.5 mg by mouth every morning.      hydrOXYzine (VISTARIL) 25 MG capsule Take 25 mg by mouth 3 (three) times daily as needed. For itching     oxyCODONE-acetaminophen (PERCOCET/ROXICET) 5-325 MG tablet Take 1 tablet by mouth every 6 (six) hours as needed for severe pain. 8 tablet 0   oxymetazoline (AFRIN) 0.05 % nasal spray Place 1 spray into both nostrils 2 (two) times daily as needed for congestion.     PARoxetine (PAXIL-CR) 25 MG 24 hr tablet Take 25 mg by mouth every morning.      simvastatin (ZOCOR)  20 MG tablet Take 20 mg by mouth daily.     No current facility-administered medications for this visit.    Past Medical History:  Diagnosis Date   Anxiety    Chronic venous insufficiency    History of gout    Hypercholesteremia    Hypertension     Past Surgical History:  Procedure Laterality Date   BACK SURGERY     lumbar disc   KNEE ARTHROSCOPY WITH MEDIAL MENISECTOMY Right 02/09/2013   Procedure: KNEE ARTHROSCOPY WITH MEDIAL MENISECTOMY with limited debridement;  Surgeon: Vickki Hearing, MD;  Location: [facility];  Service: Orthopedics;  Laterality: Right;   LAPAROSCOPIC GASTRIC BANDING      Family History  Problem Relation Age of Onset   Gallbladder disease Neg Hx    Pancreatic cancer Neg Hx    Liver disease Neg Hx     Allergies as of 11/01/2022 - Review Complete 07/29/2022  Allergen Reaction Noted   Hydrocodone-acetaminophen Nausea Only 06/24/2020    Social History   Socioeconomic History   Marital status: Married    Spouse name: Not on file   Number of children: Not on file   Years of education: Not on file   Highest education level: Not on file  Occupational History   Not on file  Tobacco Use   Smoking status: Every Day    Current packs/day: 0.00    Average packs/day: 1 pack/day for 35.0 years (35.0 ttl pk-yrs)    Types:  Cigarettes    Start date: 12/24/1977    Last attempt to quit: 12/24/2012    Years since quitting: 9.8   Smokeless tobacco: Never  Vaping Use   Vaping status: Never Used  Substance and Sexual Activity   Alcohol use: Yes    Comment: Occ   Drug use: No   Sexual activity: Yes    Birth control/protection: None  Other Topics Concern   Not on file  Social History Narrative   Not on file   Social Determinants of Health   Financial Resource Strain: Not on file  Food Insecurity: Not on file  Transportation Needs: Not on file  Physical Activity: Not on file  Stress: Not on file  Social Connections: Not on file     Review of  Systems   Gen: Denies fever, chills, anorexia. Denies fatigue, weakness, weight loss.  CV: Denies chest pain, palpitations, syncope, peripheral edema, and claudication. Resp: Denies dyspnea at rest, cough, wheezing, coughing up blood, and pleurisy. GI: See HPI Derm: Denies rash, itching, dry skin Psych: Denies depression, anxiety, memory loss, confusion. No homicidal or suicidal ideation.  Heme: Denies bruising, bleeding, and enlarged lymph nodes.   Physical Exam   There were no vitals taken for this visit.  General:   Alert and oriented. No distress noted. Pleasant and cooperative.  Head:  Normocephalic and atraumatic. Eyes:  Conjuctiva clear without scleral icterus. Mouth:  Oral mucosa pink and moist. Good dentition. No lesions. Lungs:  Clear to auscultation bilaterally. No wheezes, rales, or rhonchi. No distress.  Heart:  S1, S2 present without murmurs appreciated.  Abdomen:  +BS, soft, non-tender and non-distended. No rebound or guarding. No HSM or masses noted. Rectal: *** Msk:  Symmetrical without gross deformities. Normal posture. Extremities:  Without edema. Neurologic:  Alert and  oriented x4 Psych:  Alert and cooperative. Normal mood and affect.   Assessment  Erik Wright is a 67 y.o. male with a history of *** presenting today with   Chronic pancreatitis:   PLAN   *** HFP? MRCP Low fat diet Avoid alcohol    Brooke Bonito, MSN, FNP-BC, AGACNP-BC [facility]

## 2023-02-09 DIAGNOSIS — H5203 Hypermetropia, bilateral: Secondary | ICD-10-CM | POA: Diagnosis not present

## 2023-04-26 DIAGNOSIS — K760 Fatty (change of) liver, not elsewhere classified: Secondary | ICD-10-CM | POA: Diagnosis not present

## 2023-04-26 DIAGNOSIS — Z79899 Other long term (current) drug therapy: Secondary | ICD-10-CM | POA: Diagnosis not present

## 2023-04-26 DIAGNOSIS — R7301 Impaired fasting glucose: Secondary | ICD-10-CM | POA: Diagnosis not present

## 2023-04-26 DIAGNOSIS — D751 Secondary polycythemia: Secondary | ICD-10-CM | POA: Diagnosis not present

## 2023-04-26 DIAGNOSIS — I10 Essential (primary) hypertension: Secondary | ICD-10-CM | POA: Diagnosis not present

## 2023-04-26 DIAGNOSIS — M1000 Idiopathic gout, unspecified site: Secondary | ICD-10-CM | POA: Diagnosis not present

## 2023-04-26 DIAGNOSIS — K861 Other chronic pancreatitis: Secondary | ICD-10-CM | POA: Diagnosis not present

## 2023-04-26 DIAGNOSIS — E785 Hyperlipidemia, unspecified: Secondary | ICD-10-CM | POA: Diagnosis not present

## 2023-05-20 ENCOUNTER — Other Ambulatory Visit (HOSPITAL_COMMUNITY): Payer: Self-pay | Admitting: Internal Medicine

## 2023-05-20 DIAGNOSIS — Z8719 Personal history of other diseases of the digestive system: Secondary | ICD-10-CM

## 2023-05-27 ENCOUNTER — Ambulatory Visit (HOSPITAL_COMMUNITY)

## 2023-06-06 ENCOUNTER — Ambulatory Visit

## 2023-06-08 DIAGNOSIS — K861 Other chronic pancreatitis: Secondary | ICD-10-CM | POA: Diagnosis not present

## 2023-06-08 DIAGNOSIS — M79606 Pain in leg, unspecified: Secondary | ICD-10-CM | POA: Diagnosis not present

## 2023-07-30 ENCOUNTER — Encounter (HOSPITAL_COMMUNITY): Payer: Self-pay

## 2023-07-30 ENCOUNTER — Ambulatory Visit (HOSPITAL_COMMUNITY): Admission: RE | Admit: 2023-07-30 | Source: Ambulatory Visit

## 2023-10-12 DIAGNOSIS — M25561 Pain in right knee: Secondary | ICD-10-CM | POA: Diagnosis not present

## 2023-10-12 DIAGNOSIS — G8929 Other chronic pain: Secondary | ICD-10-CM | POA: Diagnosis not present

## 2023-10-18 DIAGNOSIS — K861 Other chronic pancreatitis: Secondary | ICD-10-CM | POA: Diagnosis not present

## 2023-10-18 DIAGNOSIS — M1711 Unilateral primary osteoarthritis, right knee: Secondary | ICD-10-CM | POA: Diagnosis not present

## 2023-10-18 DIAGNOSIS — J430 Unilateral pulmonary emphysema [MacLeod's syndrome]: Secondary | ICD-10-CM | POA: Diagnosis not present

## 2023-10-18 DIAGNOSIS — Z23 Encounter for immunization: Secondary | ICD-10-CM | POA: Diagnosis not present

## 2023-11-04 NOTE — Progress Notes (Signed)
 Sent message, via epic in basket, requesting orders in epic from Careers adviser.

## 2023-11-07 DIAGNOSIS — M1711 Unilateral primary osteoarthritis, right knee: Secondary | ICD-10-CM | POA: Diagnosis not present

## 2023-11-07 DIAGNOSIS — J439 Emphysema, unspecified: Secondary | ICD-10-CM | POA: Diagnosis not present

## 2023-11-07 DIAGNOSIS — I872 Venous insufficiency (chronic) (peripheral): Secondary | ICD-10-CM | POA: Diagnosis not present

## 2023-11-07 DIAGNOSIS — K861 Other chronic pancreatitis: Secondary | ICD-10-CM | POA: Diagnosis not present

## 2023-11-07 DIAGNOSIS — F17210 Nicotine dependence, cigarettes, uncomplicated: Secondary | ICD-10-CM | POA: Diagnosis not present

## 2023-11-07 NOTE — Progress Notes (Signed)
 PCP - Levin Langton, MD Cardiologist - n/a  PPM/ICD -  Device Orders -  Rep Notified -   Chest x-ray -  EKG - preop Stress Test -  ECHO -  Cardiac Cath -   Sleep Study - n/a CPAP - n/a  Fasting Blood Sugar - n/a Checks Blood Sugar __n/a___ times a day  Blood Thinner Instructions:n/a Aspirin Instructions:n/a  ERAS Protcol - PRE-SURGERY Ensure    COVID vaccine -yes  Activity--Able to climb a flighjt of stairs with no CP or SOB Anesthesia review: HTN, See EKG  Patient denies shortness of breath, fever, cough and chest pain at PAT appointment   All instructions explained to the patient, with a verbal understanding of the material. Patient agrees to go over the instructions while at home for a better understanding. Patient also instructed to self quarantine after being tested for COVID-19. The opportunity to ask questions was provided.

## 2023-11-07 NOTE — Patient Instructions (Signed)
 SURGICAL WAITING ROOM VISITATION  Patients having surgery or a procedure may have no more than 2 support people in the waiting area - these visitors may rotate.    Children under the age of 80 must have an adult with them who is not the patient.  Visitors with respiratory illnesses are discouraged from visiting and should remain at home.  If the patient needs to stay at the hospital during part of their recovery, the visitor guidelines for inpatient rooms apply. Pre-op nurse will coordinate an appropriate time for 1 support person to accompany patient in pre-op.  This support person may not rotate.    Please refer to the [facility] website for the visitor guidelines for Inpatients (after your surgery is over and you are in a regular room).       Your procedure is scheduled on: 11-22-23   Report to [facility] Main Entrance    Report to admitting at        10:00  AM   Call this number if you have problems the morning of surgery [phone]   Do not eat food :After Midnight.   After Midnight you may have the following  clear  liquids until _0930_____ AM/DAY OF SURGERY  then nothing by mouth  Water Non-Citrus Juices (without pulp, NO RED-Apple, White grape, White cranberry) Black Coffee (NO MILK/CREAM OR CREAMERS, sugar ok)  Clear Tea (NO MILK/CREAM OR CREAMERS, sugar ok) regular and decaf                             Plain Jell-O (NO RED)                                           Fruit ices (not with fruit pulp, NO RED)                                     Popsicles (NO RED)                                                               Sports drinks like Gatorade (NO RED)                    The day of surgery:  Drink ONE (1) Pre-Surgery Clear Ensure BY 0930 AM the morning of surgery. Drink in one sitting. Do not sip.  This drink was given to you during your hospital  pre-op appointment visit. Nothing else to drink after completing the  Pre-Surgery Clear Ensure .           If you have questions, please contact your surgeon's office.   FOLLOW  ANY ADDITIONAL PRE OP INSTRUCTIONS YOU RECEIVED FROM YOUR SURGEON'S OFFICE!!!     Oral Hygiene is also important to reduce your risk of infection.                                    Remember - BRUSH YOUR TEETH THE MORNING OF  SURGERY WITH YOUR REGULAR TOOTHPASTE  DENTURES WILL BE REMOVED PRIOR TO SURGERY PLEASE DO NOT APPLY Poly grip OR ADHESIVES!!!   Do NOT smoke after Midnight   Stop all vitamins and herbal supplements 7 days before surgery.   Take these medicines the morning of surgery with A SIP OF WATER: Simvastatin, Paroxetine, hydroxyzine  DO NOT TAKE ANY ORAL DIABETIC MEDICATIONS DAY OF YOUR SURGERY  Bring CPAP mask and tubing day of surgery.                              You may not have any metal on your body including hair pins, jewelry, and body piercing             Do not wear lotions, powders, /cologne, or deodorant   Men may shave face and neck.             Do not bring valuables to the hospital. [facility] IS NOT             RESPONSIBLE   FOR VALUABLES.   Contacts, glasses, dentures or bridgework may not be worn into surgery.   Bring small overnight bag day of surgery.   DO NOT BRING YOUR HOME MEDICATIONS TO THE HOSPITAL. PHARMACY WILL DISPENSE MEDICATIONS LISTED ON YOUR MEDICATION LIST TO YOU DURING YOUR ADMISSION IN THE HOSPITAL!    Patients discharged on the day of surgery will not be allowed to drive home.  Someone NEEDS to stay with you for the first 24 hours after anesthesia.   Special Instructions: Bring a copy of your healthcare power of attorney and living will documents the day of surgery if you haven't scanned them before.              Please read over the following fact sheets you were given: IF YOU HAVE QUESTIONS ABOUT YOUR PRE-OP INSTRUCTIONS PLEASE CALL [phone].   If you test positive for Covid or have been in contact with anyone that has tested positive in  the last 10 days please notify you surgeon.      Pre-operative 4 CHG Bath Instructions  DYNA-Hex 4 Chlorhexidine  Gluconate 4% Solution Antiseptic 4 fl. oz   You can play a key role in reducing the risk of infection after surgery. Your skin needs to be as free of germs as possible. You can reduce the number of germs on your skin by washing with CHG (chlorhexidine  gluconate) soap before surgery. CHG is an antiseptic soap that kills germs and continues to kill germs even after washing.   DO NOT use if you have an allergy to chlorhexidine /CHG or antibacterial soaps. If your skin becomes reddened or irritated, stop using the CHG and notify one of our RNs at   Please shower with the CHG soap starting 4 days before surgery using the following schedule:     Please keep in mind the following:  DO NOT shave, including legs and underarms, starting the day of your first shower.   You may shave your face at any point before/day of surgery.  Place clean sheets on your bed the day you start using CHG soap. Use a clean washcloth (not used since being washed) for each shower. DO NOT sleep with pets once you start using the CHG.  CHG Shower Instructions:  If you choose to wash your hair and private area, wash first with your normal shampoo/soap.  After you use shampoo/soap, rinse your hair and  body thoroughly to remove shampoo/soap residue.  Turn the water OFF and apply about 3 tablespoons (45 ml) of CHG soap to a CLEAN washcloth.  Apply CHG soap ONLY FROM YOUR NECK DOWN TO YOUR TOES (washing for 3-5 minutes)  DO NOT use CHG soap on face, private areas, open wounds, or sores.  Pay special attention to the area where your surgery is being performed.  If you are having back surgery, having someone wash your back for you may be helpful. Wait 2 minutes after CHG soap is applied, then you may rinse off the CHG soap.  Pat dry with a clean towel  Put on clean clothes/pajamas   If you choose to wear lotion,  please use ONLY the CHG-compatible lotions on the back of this paper.     Additional instructions for the day of surgery: DO NOT APPLY any lotions, deodorants, cologne, or perfumes.   Put on clean/comfortable clothes.  Brush your teeth.  Ask your nurse before applying any prescription medications to the skin.   CHG Compatible Lotions   Aveeno Moisturizing lotion  Cetaphil Moisturizing Cream  Cetaphil Moisturizing Lotion  Clairol Herbal Essence Moisturizing Lotion, Dry Skin  Clairol Herbal Essence Moisturizing Lotion, Extra Dry Skin  Clairol Herbal Essence Moisturizing Lotion, Normal Skin  Curel Age Defying Therapeutic Moisturizing Lotion with Alpha Hydroxy  Curel Extreme Care Body Lotion  Curel Soothing Hands Moisturizing Hand Lotion  Curel Therapeutic Moisturizing Cream, Fragrance-Free  Curel Therapeutic Moisturizing Lotion, Fragrance-Free  Curel Therapeutic Moisturizing Lotion, Original Formula  Eucerin Daily Replenishing Lotion  Eucerin Dry Skin Therapy Plus Alpha Hydroxy Crme  Eucerin Dry Skin Therapy Plus Alpha Hydroxy Lotion  Eucerin Original Crme  Eucerin Original Lotion  Eucerin Plus Crme Eucerin Plus Lotion  Eucerin TriLipid Replenishing Lotion  Keri Anti-Bacterial Hand Lotion  Keri Deep Conditioning Original Lotion Dry Skin Formula Softly Scented  Keri Deep Conditioning Original Lotion, Fragrance Free Sensitive Skin Formula  Keri Lotion Fast Absorbing Fragrance Free Sensitive Skin Formula  Keri Lotion Fast Absorbing Softly Scented Dry Skin Formula  Keri Original Lotion  Keri Skin Renewal Lotion Keri Silky Smooth Lotion  Keri Silky Smooth Sensitive Skin Lotion  Nivea Body Creamy Conditioning Oil  Nivea Body Extra Enriched Lotion  Nivea Body Original Lotion  Nivea Body Sheer Moisturizing Lotion Nivea Crme  Nivea Skin Firming Lotion  NutraDerm 30 Skin Lotion  NutraDerm Skin Lotion  NutraDerm Therapeutic Skin Cream  NutraDerm Therapeutic Skin Lotion  ProShield  Protective Hand Cream  Provon moisturizing lotion  Incentive Spirometer  An incentive spirometer is a tool that can help keep your lungs clear and active. This tool measures how well you are filling your lungs with each breath. Taking long deep breaths may help reverse or decrease the chance of developing breathing (pulmonary) problems (especially infection) following: A long period of time when you are unable to move or be active. BEFORE THE PROCEDURE  If the spirometer includes an indicator to show your best effort, your nurse or respiratory therapist will set it to a desired goal. If possible, sit up straight or lean slightly forward. Try not to slouch. Hold the incentive spirometer in an upright position. INSTRUCTIONS FOR USE  Sit on the edge of your bed if possible, or sit up as far as you can in bed or on a chair. Hold the incentive spirometer in an upright position. Breathe out normally. Place the mouthpiece in your mouth and seal your lips tightly around it. Breathe in slowly and as deeply  as possible, raising the piston or the ball toward the top of the column. Hold your breath for 3-5 seconds or for as long as possible. Allow the piston or ball to fall to the bottom of the column. Remove the mouthpiece from your mouth and breathe out normally. Rest for a few seconds and repeat Steps 1 through 7 at least 10 times every 1-2 hours when you are awake. Take your time and take a few normal breaths between deep breaths. The spirometer may include an indicator to show your best effort. Use the indicator as a goal to work toward during each repetition. After each set of 10 deep breaths, practice coughing to be sure your lungs are clear. If you have an incision (the cut made at the time of surgery), support your incision when coughing by placing a pillow or rolled up towels firmly against it. Once you are able to get out of bed, walk around indoors and cough well. You may stop using the  incentive spirometer when instructed by your caregiver.  RISKS AND COMPLICATIONS Take your time so you do not get dizzy or light-headed. If you are in pain, you may need to take or ask for pain medication before doing incentive spirometry. It is harder to take a deep breath if you are having pain. AFTER USE Rest and breathe slowly and easily. It can be helpful to keep track of a log of your progress. Your caregiver can provide you with a simple table to help with this. If you are using the spirometer at home, follow these instructions: SEEK MEDICAL CARE IF:  You are having difficultly using the spirometer. You have trouble using the spirometer as often as instructed. Your pain medication is not giving enough relief while using the spirometer. You develop fever of 100.5 F (38.1 C) or higher. SEEK IMMEDIATE MEDICAL CARE IF:  You cough up bloody sputum that had not been present before. You develop fever of 102 F (38.9 C) or greater. You develop worsening pain at or near the incision site. MAKE SURE YOU:  Understand these instructions. Will watch your condition. Will get help right away if you are not doing well or get worse. Document Released: 05/03/2006 Document Revised: 03/15/2011 Document Reviewed: 07/04/2006 [facility] Patient Information 2014 [place], [place].   ________________________________________________________________________

## 2023-11-08 ENCOUNTER — Ambulatory Visit: Payer: Self-pay | Admitting: Orthopedic Surgery

## 2023-11-08 DIAGNOSIS — Z720 Tobacco use: Secondary | ICD-10-CM

## 2023-11-11 ENCOUNTER — Encounter (HOSPITAL_COMMUNITY)
Admission: RE | Admit: 2023-11-11 | Discharge: 2023-11-11 | Disposition: A | Discharge status: Home / Self Care | Source: Ambulatory Visit | Attending: Orthopedic Surgery | Admitting: Orthopedic Surgery

## 2023-11-11 ENCOUNTER — Encounter (HOSPITAL_COMMUNITY): Payer: Self-pay

## 2023-11-11 ENCOUNTER — Other Ambulatory Visit: Payer: Self-pay

## 2023-11-11 VITALS — BP 139/90 | HR 62 | Temp 98.3°F | Resp 16 | Ht 71.0 in | Wt 257.0 lb

## 2023-11-11 DIAGNOSIS — I10 Essential (primary) hypertension: Secondary | ICD-10-CM | POA: Diagnosis not present

## 2023-11-11 DIAGNOSIS — Z01818 Encounter for other preprocedural examination: Secondary | ICD-10-CM | POA: Insufficient documentation

## 2023-11-11 HISTORY — DX: Unspecified osteoarthritis, unspecified site: M19.90

## 2023-11-11 HISTORY — DX: Headache, unspecified: R51.9

## 2023-11-11 LAB — BASIC METABOLIC PANEL WITH GFR
Anion gap: 9 (ref 5–15)
BUN: 16 mg/dL (ref 8–23)
CO2: 28 mmol/L (ref 22–32)
Calcium: 9.8 mg/dL (ref 8.9–10.3)
Chloride: 102 mmol/L (ref 98–111)
Creatinine, Ser: 0.98 mg/dL (ref 0.61–1.24)
GFR, Estimated: >60 mL/min (ref >60)
Glucose, Bld: 104 mg/dL — ABNORMAL HIGH (ref 70–99)
Potassium: 4.4 mmol/L (ref 3.5–5.1)
Sodium: 139 mmol/L (ref 135–145)

## 2023-11-11 LAB — CBC
HCT: 56.3 % — ABNORMAL HIGH (ref 39.0–52.0)
Hemoglobin: 18.2 g/dL — ABNORMAL HIGH (ref 13.0–17.0)
MCH: 29.7 pg (ref 26.0–34.0)
MCHC: 32.3 g/dL (ref 30.0–36.0)
MCV: 91.8 fL (ref 80.0–100.0)
Platelets: 163 K/uL (ref 150–400)
RBC: 6.13 MIL/uL — ABNORMAL HIGH (ref 4.22–5.81)
RDW: 13.5 % (ref 11.5–15.5)
WBC: 5.4 K/uL (ref 4.0–10.5)
nRBC: 0 % (ref 0.0–0.2)

## 2023-11-11 LAB — SURGICAL PCR SCREEN
MRSA, PCR: NEGATIVE
Staphylococcus aureus: NEGATIVE

## 2023-11-14 ENCOUNTER — Encounter (HOSPITAL_COMMUNITY): Payer: Self-pay

## 2023-11-14 NOTE — Anesthesia Preprocedure Evaluation (Addendum)
 Anesthesia Evaluation  Patient identified by MRN, date of birth, ID band Patient awake    Reviewed: Allergy & Precautions, H&P , NPO status , Patient's Chart, lab work & pertinent test results  Airway Mallampati: II   Neck ROM: full    Dental   Pulmonary Current Smoker and Patient abstained from smoking.   breath sounds clear to auscultation       Cardiovascular hypertension,  Rhythm:regular Rate:Normal     Neuro/Psych  Headaches  Anxiety        GI/Hepatic   Endo/Other    Renal/GU      Musculoskeletal  (+) Arthritis ,    Abdominal   Peds  Hematology   Anesthesia Other Findings   Reproductive/Obstetrics                              Anesthesia Physical Anesthesia Plan  ASA: 2  Anesthesia Plan: MAC and Spinal   Post-op Pain Management: Regional block*   Induction: Intravenous  PONV Risk Score and Plan: 0 and Propofol  infusion, Treatment may vary due to age or medical condition and Midazolam   Airway Management Planned: Simple Face Mask  Additional Equipment:   Intra-op Plan:   Post-operative Plan:   Informed Consent: I have reviewed the patients History and Physical, chart, labs and discussed the procedure including the risks, benefits and alternatives for the proposed anesthesia with the patient or authorized representative who has indicated his/her understanding and acceptance.     Dental advisory given  Plan Discussed with: CRNA, Anesthesiologist and Surgeon  Anesthesia Plan Comments: (PMH of current smoking, HTN, s/p gastric banding, anxiety, arthritis. Labs notable for Hgb 18.2, likely from smoking, EKG NSR with T wave inversions and RBBB which are chronic, appears similar to prior EKGs.)         Anesthesia Quick Evaluation

## 2023-11-15 NOTE — H&P (Signed)
 KNEE ARTHROPLASTY ADMISSION H&P  Patient ID: Erik Wright MRN: [id number] DOB/AGE: Feb 18, 1955 68 y.o.  Chief Complaint: right knee pain.  Planned Procedure Date: 11/22/23 Medical and Cardiac Clearance by Dr. Gaither Langton     HPI: Erik Wright is a 68 y.o. male who presents for evaluation of OA RIGHT KNEE. The patient has a history of pain and functional disability in the right knee due to arthritis and has failed non-surgical conservative treatments for greater than 12 weeks to include NSAID's and/or analgesics, corticosteriod injections, viscosupplementation injections, and activity modification.  Onset of symptoms was gradual, starting 4 years ago with gradually worsening course since that time. The patient noted prior procedures on the knee to include  arthroscopy and menisectomy on the right knee.  Patient currently rates pain at 10 out of 10 with activity. Patient has night pain, worsening of pain with activity and weight bearing, and pain that interferes with activities of daily living.  Patient has evidence of subchondral sclerosis, periarticular osteophytes, and joint space narrowing by imaging studies.  There is no active infection.  Past Medical History:  Diagnosis Date   Anxiety    Arthritis    Chronic venous insufficiency    Headache    occ. migraine   History of gout    Hypercholesteremia    Hypertension    Past Surgical History:  Procedure Laterality Date   BACK SURGERY     lumbar disc   KNEE ARTHROSCOPY WITH MEDIAL MENISECTOMY Right 02/09/2013   Procedure: KNEE ARTHROSCOPY WITH MEDIAL MENISECTOMY with limited debridement;  Surgeon: Taft FORBES Minerva, MD;  Location: [facility];  Service: Orthopedics;  Laterality: Right;   LAPAROSCOPIC GASTRIC BANDING     Allergies  Allergen Reactions   Hydrocodone -Acetaminophen  Nausea Only   Prior to Admission medications   Medication Sig Start Date End Date Taking? Authorizing Provider  chlorthalidone (HYGROTON) 25 MG tablet Take  12.5 mg by mouth every morning.    Yes [provider]  oxyCODONE  (OXY IR/ROXICODONE ) 5 MG immediate release tablet Take 5 mg by mouth every 6 (six) hours as needed (severe pain.).   Yes [provider]  PARoxetine (PAXIL-CR) 25 MG 24 hr tablet Take 25 mg by mouth every morning.    Yes [provider]  simvastatin (ZOCOR) 20 MG tablet Take 20 mg by mouth in the morning.   Yes [provider]   Social History   Socioeconomic History   Marital status: Significant Other    Spouse name: Not on file   Number of children: Not on file   Years of education: Not on file   Highest education level: Not on file  Occupational History   Not on file  Tobacco Use   Smoking status: Every Day    Current packs/day: 0.00    Average packs/day: 1 pack/day for 35.0 years (35.0 ttl pk-yrs)    Types: Cigarettes    Start date: 12/24/1977    Last attempt to quit: 12/24/2012    Years since quitting: 10.8   Smokeless tobacco: Never   Tobacco comments:    2 a day  Vaping Use   Vaping status: Never Used  Substance and Sexual Activity   Alcohol use: Not Currently    Comment: Occ   Drug use: No   Sexual activity: Yes    Birth control/protection: None  Other Topics Concern   Not on file  Social History Narrative   Not on file   Social Drivers of Health  Financial Resource Strain: Not on file  Food Insecurity: Not on file  Transportation Needs: Not on file  Physical Activity: Not on file  Stress: Not on file  Social Connections: Not on file   Family History  Problem Relation Age of Onset   Gallbladder disease Neg Hx    Pancreatic cancer Neg Hx    Liver disease Neg Hx     ROS: Currently denies lightheadedness, dizziness, Fever, chills, CP, SOB.   No personal history of DVT, PE, MI, or CVA. No loose teeth or dentures All other systems have been reviewed and were otherwise currently negative with the exception of those mentioned in the HPI and as  above.  Objective: Vitals: Ht: 5'10 Wt: 264.9 lbs Temp: 97.8 BP: 147/89 Pulse: 65 O2 95% on room air.   Physical Exam: General: Alert, NAD.  Antalgic Gait  HEENT: EOMI, Good Neck Extension  Pulm: No increased work of breathing.  Clear B/L A/P w/o crackle or wheeze.  CV: RRR, No m/g/r appreciated  GI: soft, NT, ND. BS x 4 quadrants Neuro: CN II-XII grossly intact without focal deficit.  Sensation intact distally Skin: No lesions in the area of chief complaint MSK/Surgical Site: + JLT. ROM 20-110 degrees.  4/5 strength in extension and flexion.  +EHL/FHL.  NVI.  Stable varus and valgus stress.    Imaging Review Plain radiographs demonstrate severe degenerative joint disease of the right knee.   The overall alignment ismild varus. The bone quality appears to be fair for age and reported activity level.  Preoperative templating of the joint replacement has been completed, documented, and submitted to the Operating Room personnel in order to optimize intra-operative equipment management.  Assessment: OA RIGHT KNEE Active Problems:   * No active hospital problems. *   Plan: Plan for Procedure(s): ARTHROPLASTY, KNEE, TOTAL  The patient history, physical exam, clinical judgement of the provider and imaging are consistent with end stage degenerative joint disease and total joint arthroplasty is deemed medically necessary. The treatment options including medical management, injection therapy, and arthroplasty were discussed at length. The risks and benefits of Procedure(s): ARTHROPLASTY, KNEE, TOTAL were presented and reviewed.  The risks of nonoperative treatment, versus surgical intervention including but not limited to continued pain, aseptic loosening, stiffness, dislocation/subluxation, infection, bleeding, nerve injury, blood clots, cardiopulmonary complications, morbidity, mortality, among others were discussed. The patient verbalizes understanding and wishes to proceed with the plan.   Patient is being admitted for inpatient treatment for surgery, pain control, PT, prophylactic antibiotics, VTE prophylaxis, progressive ambulation, ADL's and discharge planning.   Dental prophylaxis discussed and recommended for 2 years postoperatively.  The patient does meet the criteria for TXA which will be used perioperatively.   ASA 81 mg BID will be used postoperatively for DVT prophylaxis in addition to SCDs, and early ambulation. Plan for Tylenol , Mobic, oxycodone  for pain.   Robaxin  for muscle spasms.   Zofran  for nausea and vomiting. Colace is for constipation prevention. Pharmacy- [facility] [address] in [place] The patient is planning to be discharged home with OPPT and into the care of his girlfriend Silvio Ina who can be reached at [phone] Follow up appt 12/07/23 at 3:45pm     Gerard CHRISTELLA Ted DEVONNA Office [phone] 11/15/2023 12:46 PM

## 2023-11-16 NOTE — Care Plan (Signed)
 Ortho Bundle Case Management Note  Patient Details  Name: Erik Wright MRN: [id number] Date of Birth: [birth year]  met with patient and SO in the office for H&P. will discharge to home with family to assist. has rolling walker. CPM ordered. OPPT set up with [facility]. discharge instructions discussed and questions answered. Patient and MD in agreement with plan. Choice offered.                     DME Arranged:  CPM DME Agency:  [employer]  HH Arranged:    HH Agency:     Additional Comments: Please contact me with any questions of if this plan should need to change.  Charlies Pitch,  RN,BSN,MHA,CCM  [facility] Orthopaedic Specialist  [phone] 11/16/2023, 4:09 PM

## 2023-11-22 ENCOUNTER — Ambulatory Visit (HOSPITAL_COMMUNITY): Admitting: Certified Registered Nurse Anesthetist

## 2023-11-22 ENCOUNTER — Ambulatory Visit (HOSPITAL_COMMUNITY): Payer: Self-pay | Admitting: Physician Assistant

## 2023-11-22 ENCOUNTER — Encounter (HOSPITAL_COMMUNITY): Payer: Self-pay | Admitting: Orthopedic Surgery

## 2023-11-22 ENCOUNTER — Other Ambulatory Visit: Payer: Self-pay

## 2023-11-22 ENCOUNTER — Ambulatory Visit (HOSPITAL_COMMUNITY)
Admission: RE | Admit: 2023-11-22 | Discharge: 2023-11-22 | Disposition: A | Discharge status: Home / Self Care | Source: Ambulatory Visit | Attending: Orthopedic Surgery | Admitting: Orthopedic Surgery

## 2023-11-22 ENCOUNTER — Encounter (HOSPITAL_COMMUNITY)
Admission: RE | Disposition: A | Discharge status: Home / Self Care | Payer: Self-pay | Source: Ambulatory Visit | Attending: Orthopedic Surgery

## 2023-11-22 ENCOUNTER — Ambulatory Visit (HOSPITAL_COMMUNITY)

## 2023-11-22 DIAGNOSIS — M1711 Unilateral primary osteoarthritis, right knee: Secondary | ICD-10-CM

## 2023-11-22 DIAGNOSIS — R609 Edema, unspecified: Secondary | ICD-10-CM | POA: Diagnosis not present

## 2023-11-22 DIAGNOSIS — Z96651 Presence of right artificial knee joint: Secondary | ICD-10-CM

## 2023-11-22 DIAGNOSIS — I10 Essential (primary) hypertension: Secondary | ICD-10-CM | POA: Diagnosis not present

## 2023-11-22 DIAGNOSIS — F17210 Nicotine dependence, cigarettes, uncomplicated: Secondary | ICD-10-CM | POA: Insufficient documentation

## 2023-11-22 DIAGNOSIS — G8918 Other acute postprocedural pain: Secondary | ICD-10-CM | POA: Diagnosis not present

## 2023-11-22 HISTORY — PX: TOTAL KNEE ARTHROPLASTY: SHX125

## 2023-11-22 SURGERY — ARTHROPLASTY, KNEE, TOTAL
Anesthesia: Monitor Anesthesia Care | Site: Knee | Laterality: Right

## 2023-11-22 MED ORDER — BUPIVACAINE LIPOSOME 1.3 % IJ SUSP: 20.0000 mL | Freq: Once | INTRAMUSCULAR | Status: DC

## 2023-11-22 MED ORDER — FENTANYL CITRATE (PF) 50 MCG/ML IJ SOSY
100.0000 ug | PREFILLED_SYRINGE | INTRAMUSCULAR | Status: DC
  Administered 2023-11-22: 50 ug via INTRAVENOUS
  Filled 2023-11-22: qty 2

## 2023-11-22 MED ORDER — CEFAZOLIN SODIUM-DEXTROSE 2-4 GM/100ML-% IV SOLN: 2.0000 g | Freq: Four times a day (QID) | INTRAVENOUS | Status: DC

## 2023-11-22 MED ORDER — ACETAMINOPHEN 500 MG PO TABS
1000.0000 mg | ORAL_TABLET | Freq: Once | ORAL | Status: AC
  Administered 2023-11-22: 1000 mg via ORAL
  Filled 2023-11-22: qty 2

## 2023-11-22 MED ORDER — ORAL CARE MOUTH RINSE: 15.0000 mL | Freq: Once | OROMUCOSAL | Status: AC

## 2023-11-22 MED ORDER — POVIDONE-IODINE 10 % EX SWAB: 2.0000 | Freq: Once | CUTANEOUS | Status: DC

## 2023-11-22 MED ORDER — OXYCODONE HCL 5 MG PO TABS
ORAL_TABLET | ORAL | Status: AC
  Filled 2023-11-22: qty 1

## 2023-11-22 MED ORDER — OXYCODONE HCL 5 MG PO TABS: 5.0000 mg | ORAL_TABLET | ORAL | 0 refills | Status: AC | PRN

## 2023-11-22 MED ORDER — SODIUM CHLORIDE (PF) 0.9 % IJ SOLN
INTRAMUSCULAR | Status: AC
  Filled 2023-11-22: qty 50

## 2023-11-22 MED ORDER — ONDANSETRON 4 MG PO TBDP: 4.0000 mg | ORAL_TABLET | Freq: Three times a day (TID) | ORAL | 0 refills | Status: AC | PRN

## 2023-11-22 MED ORDER — LACTATED RINGERS IV BOLUS
500.0000 mL | Freq: Once | INTRAVENOUS | Status: AC
  Administered 2023-11-22: 500 mL via INTRAVENOUS

## 2023-11-22 MED ORDER — OXYCODONE HCL 5 MG/5ML PO SOLN: 5.0000 mg | Freq: Once | ORAL | Status: AC | PRN

## 2023-11-22 MED ORDER — DOCUSATE SODIUM 100 MG PO CAPS: 100.0000 mg | ORAL_CAPSULE | Freq: Two times a day (BID) | ORAL | 0 refills | Status: AC | PRN

## 2023-11-22 MED ORDER — MIDAZOLAM HCL (PF) 2 MG/2ML IJ SOLN
2.0000 mg | INTRAMUSCULAR | Status: DC
  Administered 2023-11-22: 1 mg via INTRAVENOUS
  Filled 2023-11-22: qty 2

## 2023-11-22 MED ORDER — OXYCODONE HCL 5 MG PO TABS
5.0000 mg | ORAL_TABLET | Freq: Once | ORAL | Status: AC | PRN
  Administered 2023-11-22: 5 mg via ORAL

## 2023-11-22 MED ORDER — PHENYLEPHRINE HCL-NACL 20-0.9 MG/250ML-% IV SOLN
INTRAVENOUS | Status: DC | PRN
  Administered 2023-11-22: 40 ug/min via INTRAVENOUS

## 2023-11-22 MED ORDER — METHOCARBAMOL 750 MG PO TABS: 750.0000 mg | ORAL_TABLET | Freq: Three times a day (TID) | ORAL | 0 refills | Status: AC | PRN

## 2023-11-22 MED ORDER — PROPOFOL 500 MG/50ML IV EMUL
INTRAVENOUS | Status: DC | PRN
Start: 2023-11-22 — End: 2023-11-22
  Administered 2023-11-22: 75 ug/kg/min via INTRAVENOUS

## 2023-11-22 MED ORDER — CHLORHEXIDINE GLUCONATE 0.12 % MT SOLN
15.0000 mL | Freq: Once | OROMUCOSAL | Status: AC
  Administered 2023-11-22: 15 mL via OROMUCOSAL

## 2023-11-22 MED ORDER — FENTANYL CITRATE (PF) 50 MCG/ML IJ SOSY: 25.0000 ug | PREFILLED_SYRINGE | INTRAMUSCULAR | Status: DC | PRN

## 2023-11-22 MED ORDER — SODIUM CHLORIDE 0.9% FLUSH
INTRAVENOUS | Status: DC | PRN
  Administered 2023-11-22: 30 mL

## 2023-11-22 MED ORDER — BUPIVACAINE IN DEXTROSE 0.75-8.25 % IT SOLN
INTRATHECAL | Status: DC | PRN
  Administered 2023-11-22: 2 mL via INTRATHECAL

## 2023-11-22 MED ORDER — BUPIVACAINE LIPOSOME 1.3 % IJ SUSP
INTRAMUSCULAR | Status: AC
  Filled 2023-11-22: qty 20

## 2023-11-22 MED ORDER — TRANEXAMIC ACID-NACL 1000-0.7 MG/100ML-% IV SOLN
1000.0000 mg | INTRAVENOUS | Status: DC
  Filled 2023-11-22: qty 100

## 2023-11-22 MED ORDER — SODIUM CHLORIDE 0.9 % IR SOLN
Status: DC | PRN
  Administered 2023-11-22: 1000 mL

## 2023-11-22 MED ORDER — EPHEDRINE SULFATE (PRESSORS) 25 MG/5ML IV SOSY
PREFILLED_SYRINGE | INTRAVENOUS | Status: DC | PRN
Start: 2023-11-22 — End: 2023-11-22
  Administered 2023-11-22: 10 mg via INTRAVENOUS

## 2023-11-22 MED ORDER — ASPIRIN 81 MG PO TBEC: 81.0000 mg | DELAYED_RELEASE_TABLET | Freq: Two times a day (BID) | ORAL | 0 refills | Status: AC

## 2023-11-22 MED ORDER — CEFAZOLIN SODIUM-DEXTROSE 2-4 GM/100ML-% IV SOLN
2.0000 g | INTRAVENOUS | Status: AC
  Administered 2023-11-22: 2 g via INTRAVENOUS
  Filled 2023-11-22: qty 100

## 2023-11-22 MED ORDER — ROPIVACAINE HCL 5 MG/ML IJ SOLN
INTRAMUSCULAR | Status: DC | PRN
  Administered 2023-11-22: 25 mL via PERINEURAL

## 2023-11-22 MED ORDER — BUPIVACAINE LIPOSOME 1.3 % IJ SUSP
INTRAMUSCULAR | Status: DC | PRN
  Administered 2023-11-22: 20 mL

## 2023-11-22 MED ORDER — WATER FOR IRRIGATION, STERILE IR SOLN
Status: DC | PRN
  Administered 2023-11-22: 2000 mL

## 2023-11-22 MED ORDER — ACETAMINOPHEN 500 MG PO TABS: 1000.0000 mg | ORAL_TABLET | Freq: Four times a day (QID) | ORAL | 0 refills | Status: AC | PRN

## 2023-11-22 MED ORDER — 0.9 % SODIUM CHLORIDE (POUR BTL) OPTIME
TOPICAL | Status: DC | PRN
  Administered 2023-11-22: 1000 mL

## 2023-11-22 MED ORDER — ONDANSETRON HCL 4 MG/2ML IJ SOLN
INTRAMUSCULAR | Status: DC | PRN
  Administered 2023-11-22: 4 mg via INTRAVENOUS

## 2023-11-22 MED ORDER — ONDANSETRON HCL 4 MG/2ML IJ SOLN: 4.0000 mg | Freq: Four times a day (QID) | INTRAMUSCULAR | Status: DC | PRN

## 2023-11-22 MED ORDER — BUPIVACAINE-EPINEPHRINE 0.25% -1:200000 IJ SOLN
INTRAMUSCULAR | Status: DC | PRN
  Administered 2023-11-22: 30 mL

## 2023-11-22 MED ORDER — DEXAMETHASONE SOD PHOSPHATE PF 10 MG/ML IJ SOLN
8.0000 mg | Freq: Once | INTRAMUSCULAR | Status: AC
  Administered 2023-11-22: 8 mg via INTRAVENOUS

## 2023-11-22 MED ORDER — BUPIVACAINE-EPINEPHRINE (PF) 0.25% -1:200000 IJ SOLN
INTRAMUSCULAR | Status: AC
  Filled 2023-11-22: qty 30

## 2023-11-22 MED ORDER — TRANEXAMIC ACID-NACL 1000-0.7 MG/100ML-% IV SOLN: 1000.0000 mg | Freq: Once | INTRAVENOUS | Status: DC

## 2023-11-22 MED ORDER — LACTATED RINGERS IV SOLN: INTRAVENOUS | Status: DC

## 2023-11-22 MED ORDER — PHENYLEPHRINE HCL (PRESSORS) 10 MG/ML IV SOLN
INTRAVENOUS | Status: DC | PRN
  Administered 2023-11-22 (×6): 160 ug via INTRAVENOUS

## 2023-11-22 MED ORDER — MELOXICAM 15 MG PO TABS: 15.0000 mg | ORAL_TABLET | Freq: Every day | ORAL | 0 refills | Status: AC | PRN

## 2023-11-22 SURGICAL SUPPLY — 43 items
BAG COUNTER SPONGE SURGICOUNT (BAG) IMPLANT
BLADE SAG 18X100X1.27 (BLADE) ×1 IMPLANT
BLADE SAGITTAL 25.0X1.37X90 (BLADE) ×1 IMPLANT
BLADE SURG 15 STRL LF DISP TIS (BLADE) ×1 IMPLANT
BNDG ELASTIC 6X10 VLCR STRL LF (GAUZE/BANDAGES/DRESSINGS) ×1 IMPLANT
BNDG ELASTIC 6X15 VLCR STRL LF (GAUZE/BANDAGES/DRESSINGS) IMPLANT
BOWL SMART MIX CTS (DISPOSABLE) IMPLANT
CLSR STERI-STRIP ANTIMIC 1/2X4 (GAUZE/BANDAGES/DRESSINGS) ×2 IMPLANT
COMPONENT TRI CR FEM SZ5 KNEE (Orthopedic Implant) IMPLANT
COVER SURGICAL LIGHT HANDLE (MISCELLANEOUS) ×1 IMPLANT
CUFF TRNQT CYL 34X4.125X (TOURNIQUET CUFF) ×1 IMPLANT
DRAPE U-SHAPE 47X51 STRL (DRAPES) ×1 IMPLANT
DRSG MEPILEX POST OP 4X12 (GAUZE/BANDAGES/DRESSINGS) ×1 IMPLANT
DURAPREP 26ML APPLICATOR (WOUND CARE) ×2 IMPLANT
ELECT PENCIL ROCKER SW 15FT (MISCELLANEOUS) ×1 IMPLANT
ELECT REM PT RETURN 15FT ADLT (MISCELLANEOUS) ×1 IMPLANT
GLOVE BIO SURGEON STRL SZ7.5 (GLOVE) ×1 IMPLANT
GLOVE BIOGEL PI IND STRL 7.5 (GLOVE) ×1 IMPLANT
GLOVE BIOGEL PI IND STRL 8 (GLOVE) ×1 IMPLANT
GLOVE SURG SYN 7.0 PF PI (GLOVE) ×1 IMPLANT
GOWN STRL REUS W/ TWL LRG LVL3 (GOWN DISPOSABLE) ×1 IMPLANT
GOWN STRL REUS W/ TWL XL LVL3 (GOWN DISPOSABLE) ×1 IMPLANT
HOLDER FOLEY CATH W/STRAP (MISCELLANEOUS) IMPLANT
IMMOBILIZER KNEE 22 UNIV (SOFTGOODS) IMPLANT
INSERT TIB BEAR CS SZ 6 13 (Insert) IMPLANT
KIT TURNOVER KIT A (KITS) ×1 IMPLANT
KNEE PATELLA ASYMMETRIC 10X35 (Knees) IMPLANT
KNEE TIBIAL COMPONENT SZ6 (Knees) IMPLANT
MANIFOLD NEPTUNE II (INSTRUMENTS) ×1 IMPLANT
NS IRRIG 1000ML POUR BTL (IV SOLUTION) ×1 IMPLANT
PACK TOTAL KNEE CUSTOM (KITS) ×1 IMPLANT
PIN FLUTED HEDLESS FIX 3.5X1/8 (PIN) IMPLANT
PROTECTOR NERVE ULNAR (MISCELLANEOUS) ×1 IMPLANT
SET HNDPC FAN SPRY TIP SCT (DISPOSABLE) ×1 IMPLANT
SPIKE FLUID TRANSFER (MISCELLANEOUS) ×1 IMPLANT
SUT MNCRL AB 3-0 PS2 18 (SUTURE) ×1 IMPLANT
SUT VIC AB 0 CT1 36 (SUTURE) ×2 IMPLANT
SUT VIC AB 1 CT1 36 (SUTURE) ×1 IMPLANT
SUT VIC AB 2-0 CT1 TAPERPNT 27 (SUTURE) ×1 IMPLANT
TOWEL GREEN STERILE FF (TOWEL DISPOSABLE) ×1 IMPLANT
TRAY FOLEY MTR SLVR 16FR STAT (SET/KITS/TRAYS/PACK) IMPLANT
TUBE SUCTION HIGH CAP CLEAR NV (SUCTIONS) ×1 IMPLANT
WRAP KNEE MAXI GEL POST OP (GAUZE/BANDAGES/DRESSINGS) ×1 IMPLANT

## 2023-11-22 NOTE — Anesthesia Procedure Notes (Signed)
 Anesthesia Regional Block: Adductor canal block   Pre-Anesthetic Checklist: , timeout performed,  Correct Patient, Correct Site, Correct Laterality,  Correct Procedure, Correct Position, site marked,  Risks and benefits discussed,  Surgical consent,  Pre-op evaluation,  At surgeon's request and post-op pain management  Laterality: Right  Prep: chloraprep       Needles:  Injection technique: Single-shot  Needle Type: Echogenic Needle     Needle Length: 9cm  Needle Gauge: 21     Additional Needles:   Narrative:  Start time: 11/22/2023 12:00 PM End time: 11/22/2023 12:10 PM Injection made incrementally with aspirations every 5 mL.  Performed by: Personally  Anesthesiologist: Maryclare Cornet, MD  Additional Notes: Pt tolerated the procedure well.

## 2023-11-22 NOTE — Transfer of Care (Signed)
 Immediate Anesthesia Transfer of Care Note  Patient: Erik Wright  Procedure(s) Performed: ARTHROPLASTY, KNEE, TOTAL (Right: Knee)  Patient Location: PACU  Anesthesia Type:Spinal  Level of Consciousness: awake, alert , and oriented  Airway & Oxygen Therapy: Patient Spontanous Breathing and Patient connected to face mask oxygen  Post-op Assessment: Report given to RN and Post -op Vital signs reviewed and stable  Post vital signs: Reviewed and stable  Last Vitals:  Vitals Value Taken Time  BP    Temp    Pulse 79 11/22/23 15:17  Resp 15 11/22/23 15:17  SpO2 99 % 11/22/23 15:17  Vitals shown include unfiled device data.  Last Pain:  Vitals:   11/22/23 1202  TempSrc:   PainSc: 0-No pain         Complications: No notable events documented.

## 2023-11-22 NOTE — Progress Notes (Signed)
 Orthopedic Tech Progress Note Patient Details:  Erik Wright 09-28-1955 [id number]  Ortho Devices Type of Ortho Device: Bone foam zero knee Ortho Device/Splint Location: right Ortho Device/Splint Interventions: Ordered, Application, Adjustment   Post Interventions Patient Tolerated: Well Instructions Provided: Adjustment of device, Care of device  [name] 11/22/2023, 3:33 PM

## 2023-11-22 NOTE — Op Note (Signed)
 DATE OF SURGERY:  11/22/2023 TIME: 2:04 PM  PATIENT NAME:  Erik Wright   AGE: 68 y.o.    PRE-OPERATIVE DIAGNOSIS:  OA RIGHT KNEE  POST-OPERATIVE DIAGNOSIS:  Same  PROCEDURE:  Procedure(s): ARTHROPLASTY, KNEE, TOTAL   SURGEON:  [name], MD   ASSISTANT:  Gerard Large, PA-C, she was present and scrubbed throughout the case, critical for completion in a timely fashion, and for retraction, instrumentation, and closure.    OPERATIVE IMPLANTS: [employer] Triathlon CR. Press fit knee  Femur size 5, Tibia size 6, Patella size 35 3-peg oval button, with a 13 mm polyethylene insert.   PREOPERATIVE INDICATIONS:  SHJON LIZARRAGA is a 68 y.o. year old male with end stage bone on bone degenerative arthritis of the knee who failed conservative treatment, including injections, antiinflammatories, activity modification, and assistive devices, and had significant impairment of their activities of daily living, and elected for Total Knee Arthroplasty.   The risks, benefits, and alternatives were discussed at length including but not limited to the risks of infection, bleeding, nerve injury, stiffness, blood clots, the need for revision surgery, cardiopulmonary complications, among others, and they were willing to proceed.   OPERATIVE DESCRIPTION:  The patient was brought to the operative room and placed in a supine position.  General anesthesia was administered.  IV antibiotics were given.  The lower extremity was prepped and draped in the usual sterile fashion.  Time out was performed.  The leg was elevated and exsanguinated and the tourniquet was inflated.  Anterior approach was performed.  The patella was everted and osteophytes were removed.  The anterior horn of the medial and lateral meniscus was removed.   The distal femur was opened with the drill and the intramedullary distal femoral cutting jig was utilized, set at 5 degrees resecting 9 mm off the distal femur.  Care was taken to  protect the collateral ligaments.  The distal femoral sizing jig was applied, taking care to avoid notching.  Then the 4-in-1 cutting jig was applied and the anterior and posterior femur was cut, along with the chamfer cuts.  All posterior osteophytes were removed.  The flexion gap was then measured and was symmetric with the extension gap.  Then the extramedullary tibial cutting jig was utilized making the appropriate cut using the anterior tibial crest as a reference building in appropriate posterior slope.  Care was taken during the cut to protect the medial and collateral ligaments.  The proximal tibia was removed along with the posterior horns of the menisci.    I completed the distal femoral preparation using the appropriate jig to prepare the box.  The patella was then measured, and cut with the saw.    The proximal tibia sized and prepared accordingly with the reamer and the punch, and then all components were trialed with the above sized poly insert.  The knee was found to have excellent balance and full motion.    The above named components were then impacted into place and Poly tibial piece and patella were inserted.  I was very happy with his stability and ROM  I performed a periarticular injection with Exparel   The knee was easily taken through a range of motion and the patella tracked well and the knee irrigated copiously and the parapatellar and subcutaneous tissue closed with vicryl, and monocryl with steri strips for the skin.  The incision was dressed with sterile gauze and the tourniquet released and the patient was awakened and returned to the  PACU in stable and satisfactory condition.  There were no complications.  Total tourniquet time was roughly 60 minutes.   POSTOPERATIVE PLAN: post op Abx, DVT px: SCD's, TED's, Early ambulation and chemical px

## 2023-11-22 NOTE — Anesthesia Procedure Notes (Signed)
 Spinal  Patient location during procedure: OR Start time: 11/22/2023 12:57 PM End time: 11/22/2023 12:59 PM Reason for block: surgical anesthesia Staffing Performed: anesthesiologist  Anesthesiologist: Maryclare Cornet, MD Performed by: Maryclare Cornet, MD Authorized by: Maryclare Cornet, MD   Preanesthetic Checklist Completed: patient identified, IV checked, risks and benefits discussed, surgical consent, monitors and equipment checked, pre-op evaluation and timeout performed Spinal Block Patient position: sitting Prep: DuraPrep Patient monitoring: cardiac monitor, continuous pulse ox and blood pressure Approach: midline Location: L3-4 Injection technique: single-shot Needle Needle type: Pencan  Needle gauge: 24 G Needle length: 9 cm Assessment Sensory level: T10 Events: CSF return Additional Notes Functioning IV was confirmed and monitors were applied. Sterile prep and drape, including hand hygiene and sterile gloves were used. The patient was positioned and the spine was prepped. The skin was anesthetized with lidocaine .  Free flow of clear CSF was obtained prior to injecting local anesthetic into the CSF.  The spinal needle aspirated freely following injection.  The needle was carefully withdrawn.  The patient tolerated the procedure well.

## 2023-11-22 NOTE — Interval H&P Note (Signed)
 History and Physical Interval Note:  11/22/2023 10:19 AM  Erik Wright  has presented today for surgery, with the diagnosis of OA RIGHT KNEE.  The various methods of treatment have been discussed with the patient and family. After consideration of risks, benefits and other options for treatment, the patient has consented to  Procedure(s): ARTHROPLASTY, KNEE, TOTAL (Right) as a surgical intervention.  The patient's history has been reviewed, patient examined, no change in status, stable for surgery.  I have reviewed the patient's chart and labs.  Questions were answered to the patient's satisfaction.     [name]

## 2023-11-22 NOTE — Discharge Instructions (Addendum)

## 2023-11-22 NOTE — Evaluation (Signed)
 Physical Therapy Evaluation Patient Details Name: Erik Wright MRN: [id number] DOB: [birth year] Today's Date: 11/22/2023  History of Present Illness  68 yo male presents to therapy s/p R TKA on 11/22/2023 due to failure of conservative measures. Pt PMH includes but is not limited to: anxiety, arthritis, HA, chronic venous insufficiency, HLD, HTN, back surgery, gastric banding and R knee arthroscopy.  Clinical Impression     Erik Wright is a 68 y.o. male POD 0 s/p R TKA. Patient reports IND with mobility at baseline. Patient is now limited by functional impairments (see PT problem list below) and requires CGA for transfers and gait with RW. Patient was able to ambulate 70 feet with RW and CGA and cues for safe walker management. Patient educated on safe sequencing for functional mobility tasks, fall risk prevention, slowly increasing activity levels, use of CP/ice, CPM, RW, pain management and goal, and car transfers pt  and Alsa  verbalized understanding of safe guarding position for people assisting with mobility. Patient instructed in exercises to facilitate ROM and circulation reviewed and HO provided. Patient will benefit from continued skilled PT interventions to address impairments and progress towards PLOF. Patient has met mobility goals at adequate level for discharge home with OPPT services; will continue to follow if pt continues acute stay to progress towards Mod I goals.       If plan is discharge home, recommend the following: A little help with walking and/or transfers;A little help with bathing/dressing/bathroom;Assistance with cooking/housework;Assist for transportation;Help with stairs or ramp for entrance   Can travel by private vehicle        Equipment Recommendations None recommended by PT  Recommendations for Other Services       Functional Status Assessment Patient has had a recent decline in their functional status and demonstrates the ability to make significant  improvements in function in a reasonable and predictable amount of time.     Precautions / Restrictions Precautions Precautions: Fall;Knee Restrictions Weight Bearing Restrictions Per Provider Order: No      Mobility  Bed Mobility Overal bed mobility: Needs Assistance Bed Mobility: Supine to Sit     Supine to sit: Supervision, HOB elevated     General bed mobility comments: min cues    Transfers Overall transfer level: Needs assistance Equipment used: Rolling walker (2 wheels) Transfers: Sit to/from Stand Sit to Stand: Contact guard assist           General transfer comment: min cues    Ambulation/Gait Ambulation/Gait assistance: Contact guard assist Gait Distance (Feet): 70 Feet Assistive device: Rolling walker (2 wheels) Gait Pattern/deviations: Step-to pattern, Decreased stance time - right, Antalgic, Trunk flexed Gait velocity: decreased     General Gait Details: slight trunk flexion, B UE support at RW to offload R LE in stance phase, min cues for safety, posture and Rw mangement  Stairs            Wheelchair Mobility     Tilt Bed    Modified Rankin (Stroke Patients Only)       Balance Overall balance assessment: Mild deficits observed, not formally tested                                           Pertinent Vitals/Pain Pain Assessment Pain Assessment: 0-10 Pain Score: 3  Pain Location: R LE, knee  and HA Pain Descriptors / Indicators: Aching,  Constant, Discomfort, Dull, Operative site guarding, Headache Pain Intervention(s): Limited activity within patient's tolerance, Monitored during session, Repositioned, Ice applied    Home Living Family/patient expects to be discharged to:: Private residence Living Arrangements: Spouse/significant other Available Help at Discharge: Family Type of Home: House Home Access: Ramped entrance       Home Layout: One level Home Equipment: Agricultural Consultant (2 wheels);Cane - single  point;Toilet riser;Shower seat;Wheelchair - manual Additional Comments: CPM    Prior Function Prior Level of Function : Independent/Modified Independent;Driving             Mobility Comments: IND no AD for all ADLs, self care tasks and IADLs       Extremity/Trunk Assessment        Lower Extremity Assessment Lower Extremity Assessment: RLE deficits/detail RLE Deficits / Details: ankle DF/PF 5/5; SLR < 10 degree lag RLE Sensation: WNL    Cervical / Trunk Assessment Cervical / Trunk Assessment: Back Surgery  Communication   Communication Communication: No apparent difficulties    Cognition Arousal: Alert Behavior During Therapy: WFL for tasks assessed/performed   PT - Cognitive impairments: No apparent impairments                         Following commands: Intact       Cueing       General Comments      Exercises Total Joint Exercises Ankle Circles/Pumps: AROM, Both, 15 reps Quad Sets: AROM, Right, 5 reps Short Arc Quad: AROM, Right, 5 reps Heel Slides: AROM, Right, 5 reps Hip ABduction/ADduction: AROM, Right, 5 reps Straight Leg Raises: AROM, Right, 5 reps Knee Flexion: AROM, Right, 5 reps, Seated   Assessment/Plan    PT Assessment Patient needs continued PT services  PT Problem List Decreased strength;Decreased range of motion;Decreased activity tolerance;Decreased balance;Decreased mobility;Decreased coordination;Pain       PT Treatment Interventions DME instruction;Gait training;Functional mobility training;Therapeutic activities;Therapeutic exercise;Balance training;Neuromuscular re-education;Patient/family education;Modalities    PT Goals (Current goals can be found in the Care Plan section)  Acute Rehab PT Goals Patient Stated Goal: to be able to travel PT Goal Formulation: With patient Time For Goal Achievement: 12/07/23 Potential to Achieve Goals: Good    Frequency 7X/week     Co-evaluation               AM-PAC PT  6 Clicks Mobility  Outcome Measure Help needed turning from your back to your side while in a flat bed without using bedrails?: None Help needed moving from lying on your back to sitting on the side of a flat bed without using bedrails?: A Little Help needed moving to and from a bed to a chair (including a wheelchair)?: A Little Help needed standing up from a chair using your arms (e.g., wheelchair or bedside chair)?: A Little Help needed to walk in hospital room?: A Little Help needed climbing 3-5 steps with a railing? : A Little 6 Click Score: 19    End of Session Equipment Utilized During Treatment: Gait belt Activity Tolerance: Patient tolerated treatment well;No increased pain Patient left: in chair;with call bell/phone within reach;with family/visitor present Nurse Communication: Mobility status;Other (comment) (pt readiness for same day d/c from PT standpoint) PT Visit Diagnosis: Unsteadiness on feet (R26.81);Other abnormalities of gait and mobility (R26.89);Muscle weakness (generalized) (M62.81);Pain;Difficulty in walking, not elsewhere classified (R26.2) Pain - Right/Left: Right Pain - part of body: Leg;Knee    Time: 1810-1841 PT Time Calculation (min) (ACUTE ONLY): 31 min  Charges:   PT Evaluation $PT Eval Low Complexity: 1 Low PT Treatments $Gait Training: 8-22 mins PT General Charges $$ ACUTE PT VISIT: 1 Visit         Erik, PT Acute Rehab   Erik Wright 11/22/2023, 6:51 PM

## 2023-11-23 ENCOUNTER — Encounter (HOSPITAL_COMMUNITY): Payer: Self-pay | Admitting: Orthopedic Surgery

## 2023-11-24 ENCOUNTER — Encounter (HOSPITAL_COMMUNITY): Payer: Self-pay | Admitting: Orthopedic Surgery

## 2023-11-24 NOTE — Anesthesia Postprocedure Evaluation (Signed)
 Anesthesia Post Note  Patient: Erik Wright  Procedure(s) Performed: ARTHROPLASTY, KNEE, TOTAL (Right: Knee)     Patient location during evaluation: PACU Anesthesia Type: Regional, Spinal and MAC Level of consciousness: oriented and awake and alert Pain management: pain level controlled Vital Signs Assessment: post-procedure vital signs reviewed and stable Respiratory status: spontaneous breathing, respiratory function stable and patient connected to nasal cannula oxygen Cardiovascular status: blood pressure returned to baseline and stable Postop Assessment: no headache, no backache and no apparent nausea or vomiting Anesthetic complications: no   No notable events documented.  Last Vitals:  Vitals:   11/22/23 1815 11/22/23 1900  BP: 120/78 125/78  Pulse: 93 94  Resp: 20 18  Temp: 36.6 C   SpO2: 93% 94%    Last Pain:  Vitals:   11/22/23 1900  TempSrc:   PainSc: 3                  Adrianna Dudas S

## 2023-11-25 ENCOUNTER — Ambulatory Visit (HOSPITAL_COMMUNITY)

## 2023-11-28 ENCOUNTER — Other Ambulatory Visit: Payer: Self-pay

## 2023-11-28 ENCOUNTER — Ambulatory Visit (HOSPITAL_COMMUNITY): Attending: Orthopedic Surgery

## 2023-11-28 ENCOUNTER — Encounter (HOSPITAL_COMMUNITY): Payer: Self-pay

## 2023-11-28 DIAGNOSIS — M6281 Muscle weakness (generalized): Secondary | ICD-10-CM | POA: Diagnosis not present

## 2023-11-28 DIAGNOSIS — Z7409 Other reduced mobility: Secondary | ICD-10-CM | POA: Insufficient documentation

## 2023-11-28 DIAGNOSIS — M25661 Stiffness of right knee, not elsewhere classified: Secondary | ICD-10-CM | POA: Diagnosis not present

## 2023-11-28 DIAGNOSIS — M25561 Pain in right knee: Secondary | ICD-10-CM | POA: Diagnosis not present

## 2023-11-28 NOTE — Therapy (Signed)
 OUTPATIENT PHYSICAL THERAPY LOWER EXTREMITY EVALUATION   Patient Name: Erik Wright MRN: [id number] DOB:02-21-1955, 68 y.o., male Today's Date: 11/28/2023  END OF SESSION:  PT End of Session - 11/28/23 1549     Visit Number 1    Number of Visits 16    Date for Recertification  12/26/23    Authorization Type [employer]    Authorization Time Period Auth requested    Progress Note Due on Visit 10    PT Start Time 1555   late arrival and then restroom   PT Stop Time 1635    PT Time Calculation (min) 40 min    Activity Tolerance Patient tolerated treatment well;No increased pain    Behavior During Therapy WFL for tasks assessed/performed          Past Medical History:  Diagnosis Date   Anxiety    Arthritis    Chronic venous insufficiency    Headache    occ. migraine   History of gout    Hypercholesteremia    Hypertension    Past Surgical History:  Procedure Laterality Date   BACK SURGERY     lumbar disc   KNEE ARTHROSCOPY WITH MEDIAL MENISECTOMY Right 02/09/2013   Procedure: KNEE ARTHROSCOPY WITH MEDIAL MENISECTOMY with limited debridement;  Surgeon: Taft FORBES Minerva, MD;  Location: [facility];  Service: Orthopedics;  Laterality: Right;   LAPAROSCOPIC GASTRIC BANDING     TOTAL KNEE ARTHROPLASTY Right 11/22/2023   Procedure: ARTHROPLASTY, KNEE, TOTAL;  Surgeon: Beverley Evalene BIRCH, MD;  Location: [facility];  Service: Orthopedics;  Laterality: Right;   Patient Active Problem List   Diagnosis Date Noted   S/P total knee arthroplasty, right 11/22/2023   Derangement of posterior horn of medial meniscus 02/01/2013   Back pain without radiation 12/20/2012   Osteoarthritis of right knee 12/20/2012   Left ankle pain 12/20/2012   Medial meniscus, posterior horn derangement 12/20/2012   Knee pain 12/20/2012   Fractured great toe 06/26/2012   Constipation 06/26/2012    PCP: Sheryle Carwin, MD  REFERRING PROVIDER: Beverley Evalene BIRCH, MD  REFERRING DIAG:  [phone] (ICD-10-CM) -  Presence of right artificial knee joint   THERAPY DIAG:  Decreased range of motion (ROM) of right knee  Acute pain of right knee  Impaired functional mobility, balance, gait, and endurance  Muscle weakness  Rationale for Evaluation and Treatment: Rehabilitation  ONSET DATE: 11/22/23  SUBJECTIVE:   SUBJECTIVE STATEMENT: Patient reports he did not have any home therapy. Surgery was on 11/22/23. Reports he has CPM machine but he is unable to get foot into machine. Technician came before surgery to help set up machine.   PERTINENT HISTORY: Anxiety Arthritis History of gout Back pain   PAIN:  Are you having pain? Yes: NPRS scale: 9/10 Pain location: R knee Pain description: Constant ache Aggravating factors: Any motion and activity Relieving factors: Pain Medication   PRECAUTIONS: None  RED FLAGS: None  Hasn't had a BM in 6-7 days, encouraged to contact MD  WEIGHT BEARING RESTRICTIONS: No  FALLS:  Has patient fallen in last 6 months? No  LIVING ENVIRONMENT: Stairs: No Has following equipment at home: Single point cane, Walker - 2 wheeled, Wheelchair (manual), shower chair, and bed side commode, walk in shower  OCCUPATION: Retired   PLOF: Needs assistance with ADLs  PATIENT GOALS: To be able to walk pain free  NEXT MD VISIT: One week, 12/06/23  OBJECTIVE:  Note: Objective measures were completed at Evaluation unless otherwise noted.  DIAGNOSTIC FINDINGS:    PATIENT SURVEYS:  LEFS  Extreme difficulty/unable (0), Quite a bit of difficulty (1), Moderate difficulty (2), Little difficulty (3), No difficulty (4) Survey date:    Any of your usual work, housework or school activities   2. Usual hobbies, recreational or sporting activities   3. Getting into/out of the bath   4. Walking between rooms   5. Putting on socks/shoes   6. Squatting    7. Lifting an object, like a bag of groceries from the floor   8. Performing light activities around your home   9.  Performing heavy activities around your home   10. Getting into/out of a car   11. Walking 2 blocks   12. Walking 1 mile   13. Going up/down 10 stairs (1 flight)   14. Standing for 1 hour   15.  sitting for 1 hour   16. Running on even ground   17. Running on uneven ground   18. Making sharp turns while running fast   19. Hopping    20. Rolling over in bed   Score total:  Lower Extremity Functional Score: 4 / 80 = 5.0 %      COGNITION: Overall cognitive status: Within functional limits for tasks assessed   SENSATION: WFL  EDEMA:  Mild edema in R knee, bruising around dressing (typical for post-op) and thigh where tourniquet was applied   POSTURE: rounded shoulders, forward head, and increased thoracic kyphosis  PALPATION: TTP lateral aspect of R knee joint TTP inferior aspect just along outline of dressing   LOWER EXTREMITY ROM:  Active ROM Right eval Left eval  Hip flexion    Hip extension    Hip abduction    Hip adduction    Hip internal rotation    Hip external rotation    Knee flexion 67   Knee extension -21   Ankle dorsiflexion    Ankle plantarflexion    Ankle inversion    Ankle eversion     (Blank rows = not tested)    LOWER EXTREMITY MMT:  MMT Right eval Left eval  Hip flexion    Hip extension    Hip abduction    Hip adduction    Hip internal rotation    Hip external rotation    Knee flexion    Knee extension    Ankle dorsiflexion 5 (dec AROM of note) 5  Ankle plantarflexion    Ankle inversion    Ankle eversion     (Blank rows = not tested)  LOWER EXTREMITY SPECIAL TESTS:    FUNCTIONAL TESTS:  5 times sit to stand: 41 seconds, R foot forward, BUE push off 2 minute walk test: Next Session   GAIT: Distance walked: 50 ft in session Assistive device utilized: Walker - 2 wheeled Level of assistance: Modified independence Comments: Pt ambulates in session with RW, dec knee flex/ext with RLE, Dec stance time on R, dec velocity  TREATMENT DATE:  11/28/23: PT Eval and HEP    PATIENT EDUCATION:  Education details: PT evaluation, objective findings, POC, Importance of HEP, Precautions, Clinic policies  Person educated: Patient and Spouse Education method: Explanation and Demonstration Education comprehension: verbalized understanding and returned demonstration  HOME EXERCISE PROGRAM: Access Code: [email or web address] URL: [email or web address] Date: 11/28/2023 Prepared by: Rosaria Powell-Butler  Exercises - Quad Setting and Stretching  - 2 x daily - 7 x weekly - 2 sets - 10 reps - 10 hold - Supine Heel Slide with Strap  - 2 x daily - 7 x weekly - 2 sets - 10 reps - 10 hold - Seated Long Arc Quad  - 2 x daily - 7 x weekly - 2 sets - 10 reps - Seated Knee Flexion Stretch  - 2 x daily - 7 x weekly - 2 sets - 10 reps - 10 hold - Sit to Stand  - 2 x daily - 7 x weekly - 2 sets - 10 reps  ASSESSMENT:  CLINICAL IMPRESSION: Patient is a 68 y.o. male who was seen today for physical therapy evaluation and treatment for  Z96.651 (ICD-10-CM) - Presence of right artificial knee joint  . On this date, patient demonstrates impaired self perception of function, decreased R knee ROM/mobility, decreased LE strength, decreased endurance, and impaired balance, all of which may be contributing to patient's increased pain, decreased activity tolerance, altered gait pattern, and impairing their overall function. Educated patient and significant other on signs on DVT/blood clots, contacting technician/support for CPM machine set up, and importance of walking program (beginning at 10 minutes a day) and HEP compliance. Patient reports understanding. Patient will benefit from skilled physical therapy to address the above/below deficits in order to improve pain and overall function.     OBJECTIVE IMPAIRMENTS: Abnormal  gait, cardiopulmonary status limiting activity, decreased activity tolerance, decreased balance, decreased endurance, decreased mobility, difficulty walking, decreased ROM, decreased strength, increased edema, impaired perceived functional ability, improper body mechanics, postural dysfunction, and pain.   ACTIVITY LIMITATIONS: carrying, lifting, bending, sitting, standing, squatting, stairs, transfers, bathing, and dressing  PARTICIPATION LIMITATIONS: meal prep, cleaning, laundry, driving, community activity, and yard work  PERSONAL FACTORS: N/A are also affecting patient's functional outcome.   REHAB POTENTIAL: Good  CLINICAL DECISION MAKING: Stable/uncomplicated  EVALUATION COMPLEXITY: Low   GOALS: Goals reviewed with patient? No  SHORT TERM GOALS: Target date: 12/26/23 Patient will be independent with performance of HEP to demonstrate adequate self management of symptoms.  Baseline:  Goal status: INITIAL  2.   Patient will report at least a 25% improvement with function and/or pain reduction overall since beginning PT. Baseline:  Goal status: INITIAL   LONG TERM GOALS: Target date: 01/23/23 Patient will improve LEFS score by 9 points to demonstrate improved perceived function while meeting MCID.  Baseline: Goal status: INITIAL 2.  Patient will improve  R knee ROM to at least 120 degrees flexion to demonstrate improved LE mobility needed for functional transfers and gait mechanics.  Baseline:  Goal status: INITIAL 3.  Patient will improve  R knee ROM to at least 0 degrees extension to demonstrate improved LE mobility needed for functional transfers and gait mechanics.  Baseline:  Goal status: INITIAL 4.  Patient will improve 5 times sit to stand test by at least 10 seconds to demonstrate improved LE strength/power needed for prolonged standing required for iADLs.  Baseline:  Goal status: INITIAL   5.  Patient will increase 2 minute walk test gait distance to  40 ft with  least restrictive assistive device to demonstrate improved endurance and functional mobility needed for ambulating household and community distances.  Baseline: Goal status: INITIAL    PLAN:  PT FREQUENCY: 2x/week  PT DURATION: 8 weeks  PLANNED INTERVENTIONS: 97164- PT Re-evaluation, 97110-Therapeutic exercises, 97530- Therapeutic activity, [id number]- Neuromuscular re-education, 97535- Self Care, 02859- Manual therapy, [phone]- Gait training, [phone]- Electrical stimulation (manual), [id number]- Ultrasound, 02987- Traction (mechanical), [phone] (1-2 muscles), 20561 (3+ muscles)- Dry Needling, Patient/Family education, Balance training, Stair training, Taping, Joint mobilization, Spinal mobilization, Scar mobilization, Cryotherapy, and Moist heat  PLAN FOR NEXT SESSION: Review goals - HEP - and DVT signs, , progress LE mobility and strengthening, follow up on CPM machine (if pt contacted representative to come adjust at his home)   5:55 PM, 11/28/23 Rosaria Settler, PT, DPT [facility] Auth Request Treatment Start Date: 11/28/23  Referring diagnosis code (ICD 10)?  S03.348 (ICD-10-CM) - Presence of right artificial knee joint   Treatment diagnosis codes (ICD 10)? (if different than referring diagnosis)  M25.661 M25.561 M62.81 Z74.09  What was this (referring dx) caused by? [x]  Surgery []  Fall []  Ongoing issue []  Arthritis []  Other: ____________  Laterality: [x]  Rt []  Lt []  Both  Deficits: [x]  Pain [x]  Stiffness [x]  Weakness [x]  Edema [x]  Balance Deficits [x]  Coordination [x]  Gait Disturbance [x]  ROM []  Other   Functional Tool Score: Lower Extremity Functional Score: 4 / 80 = 5.0 % 5 times sit to stand: 41 seconds   CPT codes: See Planned Interventions listed in the Plan section of the Evaluation.

## 2023-11-30 ENCOUNTER — Ambulatory Visit (HOSPITAL_COMMUNITY)

## 2023-11-30 ENCOUNTER — Encounter (HOSPITAL_COMMUNITY): Payer: Self-pay

## 2023-11-30 NOTE — Therapy (Incomplete)
 OUTPATIENT PHYSICAL THERAPY LOWER EXTREMITY TREATMENT    Patient Name: Erik Wright MRN: [id number] DOB:10-06-1955, 68 y.o., male Today's Date: 11/30/2023  END OF SESSION:    Past Medical History:  Diagnosis Date   Anxiety    Arthritis    Chronic venous insufficiency    Headache    occ. migraine   History of gout    Hypercholesteremia    Hypertension    Past Surgical History:  Procedure Laterality Date   BACK SURGERY     lumbar disc   KNEE ARTHROSCOPY WITH MEDIAL MENISECTOMY Right 02/09/2013   Procedure: KNEE ARTHROSCOPY WITH MEDIAL MENISECTOMY with limited debridement;  Surgeon: Taft FORBES Minerva, MD;  Location: [facility];  Service: Orthopedics;  Laterality: Right;   LAPAROSCOPIC GASTRIC BANDING     TOTAL KNEE ARTHROPLASTY Right 11/22/2023   Procedure: ARTHROPLASTY, KNEE, TOTAL;  Surgeon: Beverley Evalene BIRCH, MD;  Location: [facility];  Service: Orthopedics;  Laterality: Right;   Patient Active Problem List   Diagnosis Date Noted   S/P total knee arthroplasty, right 11/22/2023   Derangement of posterior horn of medial meniscus 02/01/2013   Back pain without radiation 12/20/2012   Osteoarthritis of right knee 12/20/2012   Left ankle pain 12/20/2012   Medial meniscus, posterior horn derangement 12/20/2012   Knee pain 12/20/2012   Fractured great toe 06/26/2012   Constipation 06/26/2012    PCP: Sheryle Carwin, MD  REFERRING PROVIDER: Beverley Evalene BIRCH, MD  REFERRING DIAG:  [phone] (ICD-10-CM) - Presence of right artificial knee joint   THERAPY DIAG:  No diagnosis found.  Rationale for Evaluation and Treatment: Rehabilitation  ONSET DATE: 11/22/23  SUBJECTIVE:   SUBJECTIVE STATEMENT: ***  Eval: Patient reports he did not have any home therapy. Surgery was on 11/22/23. Reports he has CPM machine but he is unable to get foot into machine. Technician came before surgery to help set up machine.   PERTINENT HISTORY: Anxiety Arthritis History of gout Back pain    PAIN:  Are you having pain? Yes: NPRS scale: 9/10 Pain location: R knee Pain description: Constant ache Aggravating factors: Any motion and activity Relieving factors: Pain Medication   PRECAUTIONS: None  RED FLAGS: None  Hasn't had a BM in 6-7 days, encouraged to contact MD  WEIGHT BEARING RESTRICTIONS: No  FALLS:  Has patient fallen in last 6 months? No  LIVING ENVIRONMENT: Stairs: No Has following equipment at home: Single point cane, Walker - 2 wheeled, Wheelchair (manual), shower chair, and bed side commode, walk in shower  OCCUPATION: Retired   PLOF: Needs assistance with ADLs  PATIENT GOALS: To be able to walk pain free  NEXT MD VISIT: One week, 12/06/23  OBJECTIVE:  Note: Objective measures were completed at Evaluation unless otherwise noted.  DIAGNOSTIC FINDINGS:    PATIENT SURVEYS:  LEFS  Extreme difficulty/unable (0), Quite a bit of difficulty (1), Moderate difficulty (2), Little difficulty (3), No difficulty (4) Survey date:    Any of your usual work, housework or school activities   2. Usual hobbies, recreational or sporting activities   3. Getting into/out of the bath   4. Walking between rooms   5. Putting on socks/shoes   6. Squatting    7. Lifting an object, like a bag of groceries from the floor   8. Performing light activities around your home   9. Performing heavy activities around your home   10. Getting into/out of a car   11. Walking 2 blocks   12. Walking 1  mile   13. Going up/down 10 stairs (1 flight)   14. Standing for 1 hour   15.  sitting for 1 hour   16. Running on even ground   17. Running on uneven ground   18. Making sharp turns while running fast   19. Hopping    20. Rolling over in bed   Score total:  Lower Extremity Functional Score: 4 / 80 = 5.0 %      COGNITION: Overall cognitive status: Within functional limits for tasks assessed   SENSATION: WFL  EDEMA:  Mild edema in R knee, bruising around dressing  (typical for post-op) and thigh where tourniquet was applied   POSTURE: rounded shoulders, forward head, and increased thoracic kyphosis  PALPATION: TTP lateral aspect of R knee joint TTP inferior aspect just along outline of dressing   LOWER EXTREMITY ROM:  Active ROM Right eval Left eval  Hip flexion    Hip extension    Hip abduction    Hip adduction    Hip internal rotation    Hip external rotation    Knee flexion 67   Knee extension -21   Ankle dorsiflexion    Ankle plantarflexion    Ankle inversion    Ankle eversion     (Blank rows = not tested)    LOWER EXTREMITY MMT:  MMT Right eval Left eval  Hip flexion    Hip extension    Hip abduction    Hip adduction    Hip internal rotation    Hip external rotation    Knee flexion    Knee extension    Ankle dorsiflexion 5 (dec AROM of note) 5  Ankle plantarflexion    Ankle inversion    Ankle eversion     (Blank rows = not tested)  LOWER EXTREMITY SPECIAL TESTS:    FUNCTIONAL TESTS:  5 times sit to stand: 41 seconds, R foot forward, BUE push off 2 minute walk test: Next Session   GAIT: Distance walked: 50 ft in session Assistive device utilized: Walker - 2 wheeled Level of assistance: Modified independence Comments: Pt ambulates in session with RW, dec knee flex/ext with RLE, Dec stance time on R, dec velocity                                                                                                                                TREATMENT DATE:                                    11/30/23 EXERCISE LOG  Exercise Repetitions and Resistance Comments                       Blank cell = exercise not performed today   11/28/23: PT Eval and HEP    PATIENT EDUCATION:  Education details: PT evaluation, objective findings,  POC, Importance of HEP, Precautions, Clinic policies  Person educated: Patient and Spouse Education method: Explanation and Demonstration Education comprehension: verbalized  understanding and returned demonstration  HOME EXERCISE PROGRAM: Access Code: [email or web address] URL: [email or web address] Date: 11/28/2023 Prepared by: Rosaria Powell-Butler  Exercises - Quad Setting and Stretching  - 2 x daily - 7 x weekly - 2 sets - 10 reps - 10 hold - Supine Heel Slide with Strap  - 2 x daily - 7 x weekly - 2 sets - 10 reps - 10 hold - Seated Long Arc Quad  - 2 x daily - 7 x weekly - 2 sets - 10 reps - Seated Knee Flexion Stretch  - 2 x daily - 7 x weekly - 2 sets - 10 reps - 10 hold - Sit to Stand  - 2 x daily - 7 x weekly - 2 sets - 10 reps  ASSESSMENT:  CLINICAL IMPRESSION: ***  Eval: Patient is a 68 y.o. male who was seen today for physical therapy evaluation and treatment for  Z96.651 (ICD-10-CM) - Presence of right artificial knee joint  . On this date, patient demonstrates impaired self perception of function, decreased R knee ROM/mobility, decreased LE strength, decreased endurance, and impaired balance, all of which may be contributing to patient's increased pain, decreased activity tolerance, altered gait pattern, and impairing their overall function. Educated patient and significant other on signs on DVT/blood clots, contacting technician/support for CPM machine set up, and importance of walking program (beginning at 10 minutes a day) and HEP compliance. Patient reports understanding. Patient will benefit from skilled physical therapy to address the above/below deficits in order to improve pain and overall function.     OBJECTIVE IMPAIRMENTS: Abnormal gait, cardiopulmonary status limiting activity, decreased activity tolerance, decreased balance, decreased endurance, decreased mobility, difficulty walking, decreased ROM, decreased strength, increased edema, impaired perceived functional ability, improper body mechanics, postural dysfunction, and pain.   ACTIVITY LIMITATIONS: carrying, lifting, bending, sitting, standing, squatting, stairs, transfers,  bathing, and dressing  PARTICIPATION LIMITATIONS: meal prep, cleaning, laundry, driving, community activity, and yard work  PERSONAL FACTORS: N/A are also affecting patient's functional outcome.   REHAB POTENTIAL: Good  CLINICAL DECISION MAKING: Stable/uncomplicated  EVALUATION COMPLEXITY: Low   GOALS: Goals reviewed with patient? No  SHORT TERM GOALS: Target date: 12/26/23 Patient will be independent with performance of HEP to demonstrate adequate self management of symptoms.  Baseline:  Goal status: INITIAL  2.   Patient will report at least a 25% improvement with function and/or pain reduction overall since beginning PT. Baseline:  Goal status: INITIAL   LONG TERM GOALS: Target date: 01/23/23 Patient will improve LEFS score by 9 points to demonstrate improved perceived function while meeting MCID.  Baseline: Goal status: INITIAL 2.  Patient will improve  R knee ROM to at least 120 degrees flexion to demonstrate improved LE mobility needed for functional transfers and gait mechanics.  Baseline:  Goal status: INITIAL 3.  Patient will improve  R knee ROM to at least 0 degrees extension to demonstrate improved LE mobility needed for functional transfers and gait mechanics.  Baseline:  Goal status: INITIAL 4.  Patient will improve 5 times sit to stand test by at least 10 seconds to demonstrate improved LE strength/power needed for prolonged standing required for iADLs.  Baseline:  Goal status: INITIAL   5.  Patient will increase 2 minute walk test gait distance to 40 ft with least restrictive assistive device to demonstrate improved endurance and functional mobility needed for  ambulating household and community distances.  Baseline: Goal status: INITIAL    PLAN:  PT FREQUENCY: 2x/week  PT DURATION: 8 weeks  PLANNED INTERVENTIONS: 97164- PT Re-evaluation, 97110-Therapeutic exercises, 97530- Therapeutic activity, [id number]- Neuromuscular re-education, 97535- Self Care,  97140- Manual therapy, [id number]- Gait training, 02967- Electrical stimulation (manual), [id number]- Ultrasound, 02987- Traction (mechanical), [phone] (1-2 muscles), 20561 (3+ muscles)- Dry Needling, Patient/Family education, Balance training, Stair training, Taping, Joint mobilization, Spinal mobilization, Scar mobilization, Cryotherapy, and Moist heat  PLAN FOR NEXT SESSION: Review goals - HEP - and DVT signs, , progress LE mobility and strengthening, follow up on CPM machine (if pt contacted representative to come adjust at his home)  Lacinda Fass, PT, DPT  11:13 AM, 11/30/23

## 2023-12-06 ENCOUNTER — Ambulatory Visit (HOSPITAL_COMMUNITY)

## 2023-12-06 DIAGNOSIS — M6281 Muscle weakness (generalized): Secondary | ICD-10-CM | POA: Insufficient documentation

## 2023-12-06 DIAGNOSIS — M25661 Stiffness of right knee, not elsewhere classified: Secondary | ICD-10-CM | POA: Insufficient documentation

## 2023-12-06 DIAGNOSIS — Z7409 Other reduced mobility: Secondary | ICD-10-CM | POA: Insufficient documentation

## 2023-12-06 DIAGNOSIS — M25561 Pain in right knee: Secondary | ICD-10-CM | POA: Diagnosis present

## 2023-12-06 NOTE — Therapy (Signed)
 OUTPATIENT PHYSICAL THERAPY LOWER EXTREMITY TREATMENT   Patient Name: Erik Wright MRN: [id number] DOB:Oct 16, 1955, 68 y.o., male Today's Date: 12/06/2023  END OF SESSION:  PT End of Session - 12/06/23 1331     Visit Number 2    Number of Visits 16    Date for Recertification  12/26/23    Authorization Type [employer]    Authorization Time Period Auth requested    Progress Note Due on Visit 10    PT Start Time 1331    PT Stop Time 1411    PT Time Calculation (min) 40 min    Activity Tolerance Patient tolerated treatment well;No increased pain    Behavior During Therapy WFL for tasks assessed/performed          Past Medical History:  Diagnosis Date   Anxiety    Arthritis    Chronic venous insufficiency    Headache    occ. migraine   History of gout    Hypercholesteremia    Hypertension    Past Surgical History:  Procedure Laterality Date   BACK SURGERY     lumbar disc   KNEE ARTHROSCOPY WITH MEDIAL MENISECTOMY Right 02/09/2013   Procedure: KNEE ARTHROSCOPY WITH MEDIAL MENISECTOMY with limited debridement;  Surgeon: Taft FORBES Minerva, MD;  Location: [facility];  Service: Orthopedics;  Laterality: Right;   LAPAROSCOPIC GASTRIC BANDING     TOTAL KNEE ARTHROPLASTY Right 11/22/2023   Procedure: ARTHROPLASTY, KNEE, TOTAL;  Surgeon: Beverley Evalene BIRCH, MD;  Location: [facility];  Service: Orthopedics;  Laterality: Right;   Patient Active Problem List   Diagnosis Date Noted   S/P total knee arthroplasty, right 11/22/2023   Derangement of posterior horn of medial meniscus 02/01/2013   Back pain without radiation 12/20/2012   Osteoarthritis of right knee 12/20/2012   Left ankle pain 12/20/2012   Medial meniscus, posterior horn derangement 12/20/2012   Knee pain 12/20/2012   Fractured great toe 06/26/2012   Constipation 06/26/2012    PCP: Sheryle Carwin, MD  REFERRING PROVIDER: Beverley Evalene BIRCH, MD  REFERRING DIAG:  [phone] (ICD-10-CM) - Presence of right artificial knee  joint   THERAPY DIAG:  Decreased range of motion (ROM) of right knee  Acute pain of right knee  Impaired functional mobility, balance, gait, and endurance  Muscle weakness  Rationale for Evaluation and Treatment: Rehabilitation  ONSET DATE: 11/22/23  SUBJECTIVE:   SUBJECTIVE STATEMENT: Calf and hip on right side are hurting; he arrives today with [facility] in right hand; no issues with HEP  EVAL:Patient reports he did not have any home therapy. Surgery was on 11/22/23. Reports he has CPM machine but he is unable to get foot into machine. Technician came before surgery to help set up machine.   PERTINENT HISTORY: Anxiety Arthritis History of gout Back pain   PAIN:  Are you having pain? Yes: NPRS scale: 9/10 Pain location: R knee Pain description: Constant ache Aggravating factors: Any motion and activity Relieving factors: Pain Medication   PRECAUTIONS: None  RED FLAGS: None  Hasn't had a BM in 6-7 days, encouraged to contact MD  WEIGHT BEARING RESTRICTIONS: No  FALLS:  Has patient fallen in last 6 months? No  LIVING ENVIRONMENT: Stairs: No Has following equipment at home: Single point cane, Walker - 2 wheeled, Wheelchair (manual), shower chair, and bed side commode, walk in shower  OCCUPATION: Retired   PLOF: Needs assistance with ADLs  PATIENT GOALS: To be able to walk pain free  NEXT MD VISIT: One  week, 12/06/23  OBJECTIVE:  Note: Objective measures were completed at Evaluation unless otherwise noted.  DIAGNOSTIC FINDINGS:    PATIENT SURVEYS:  LEFS  Extreme difficulty/unable (0), Quite a bit of difficulty (1), Moderate difficulty (2), Little difficulty (3), No difficulty (4) Survey date:    Any of your usual work, housework or school activities   2. Usual hobbies, recreational or sporting activities   3. Getting into/out of the bath   4. Walking between rooms   5. Putting on socks/shoes   6. Squatting    7. Lifting an object, like a bag of  groceries from the floor   8. Performing light activities around your home   9. Performing heavy activities around your home   10. Getting into/out of a car   11. Walking 2 blocks   12. Walking 1 mile   13. Going up/down 10 stairs (1 flight)   14. Standing for 1 hour   15.  sitting for 1 hour   16. Running on even ground   17. Running on uneven ground   18. Making sharp turns while running fast   19. Hopping    20. Rolling over in bed   Score total:  Lower Extremity Functional Score: 4 / 80 = 5.0 %      COGNITION: Overall cognitive status: Within functional limits for tasks assessed   SENSATION: WFL  EDEMA:  Mild edema in R knee, bruising around dressing (typical for post-op) and thigh where tourniquet was applied   POSTURE: rounded shoulders, forward head, and increased thoracic kyphosis  PALPATION: TTP lateral aspect of R knee joint TTP inferior aspect just along outline of dressing   LOWER EXTREMITY ROM:  Active ROM Right eval Left eval Right 12/06/23  Hip flexion     Hip extension     Hip abduction     Hip adduction     Hip internal rotation     Hip external rotation     Knee flexion 67  95  Knee extension -21  -8  Ankle dorsiflexion     Ankle plantarflexion     Ankle inversion     Ankle eversion      (Blank rows = not tested)    LOWER EXTREMITY MMT:  MMT Right eval Left eval  Hip flexion    Hip extension    Hip abduction    Hip adduction    Hip internal rotation    Hip external rotation    Knee flexion    Knee extension    Ankle dorsiflexion 5 (dec AROM of note) 5  Ankle plantarflexion    Ankle inversion    Ankle eversion     (Blank rows = not tested)  LOWER EXTREMITY SPECIAL TESTS:    FUNCTIONAL TESTS:  5 times sit to stand: 41 seconds, R foot forward, BUE push off 2 minute walk test: Next Session   GAIT: Distance walked: 50 ft in session Assistive device utilized: Walker - 2 wheeled Level of assistance: Modified  independence Comments: Pt ambulates in session with RW, dec knee flex/ext with RLE, Dec stance time on R, dec velocity  TREATMENT DATE:  12/06/23 Review HEP and goals 2 MWT 315 ft carrying SPC 12 box right knee drives for flexion x 2' Standing toe raises 2 x 10 Slant board 3 x 20 Supine: Quad sets 5 x 10 Heel slides x 10 Seated heel slides x 10 AROM right knee 95 Bike seat 16 x 5' half revolutions      11/28/23: PT Eval and HEP    PATIENT EDUCATION:  Education details: PT evaluation, objective findings, POC, Importance of HEP, Precautions, Clinic policies  Person educated: Patient and Spouse Education method: Explanation and Demonstration Education comprehension: verbalized understanding and returned demonstration  HOME EXERCISE PROGRAM: Access Code: [email or web address] URL: [email or web address] Date: 11/28/2023 Prepared by: Rosaria Powell-Butler  Exercises - Quad Setting and Stretching  - 2 x daily - 7 x weekly - 2 sets - 10 reps - 10 hold - Supine Heel Slide with Strap  - 2 x daily - 7 x weekly - 2 sets - 10 reps - 10 hold - Seated Long Arc Quad  - 2 x daily - 7 x weekly - 2 sets - 10 reps - Seated Knee Flexion Stretch  - 2 x daily - 7 x weekly - 2 sets - 10 reps - 10 hold - Sit to Stand  - 2 x daily - 7 x weekly - 2 sets - 10 reps  ASSESSMENT:  CLINICAL IMPRESSION: Todays session started with a review of HEP and goals.  Patient is agreeable to set rehab goals.  Performed 2 MWT; has a SPC but carries it throughout the test.  Antalgic gait; decreased heel strike and toe off right lower extremity and wide BOS.  Sees MD tomorrow; waterproof bandage in place.  Noted some calf tenderness but is soft to palpation; however asked patient to mention to MD as he has history of venous insufficiency in his legs. Good progress with knee mobility  flexion today 95.   Patient will benefit from continued skilled therapy services to address deficits and promote return to optimal function.       Eval:Patient is a 68 y.o. male who was seen today for physical therapy evaluation and treatment for  Z96.651 (ICD-10-CM) - Presence of right artificial knee joint  . On this date, patient demonstrates impaired self perception of function, decreased R knee ROM/mobility, decreased LE strength, decreased endurance, and impaired balance, all of which may be contributing to patient's increased pain, decreased activity tolerance, altered gait pattern, and impairing their overall function. Educated patient and significant other on signs on DVT/blood clots, contacting technician/support for CPM machine set up, and importance of walking program (beginning at 10 minutes a day) and HEP compliance. Patient reports understanding. Patient will benefit from skilled physical therapy to address the above/below deficits in order to improve pain and overall function.     OBJECTIVE IMPAIRMENTS: Abnormal gait, cardiopulmonary status limiting activity, decreased activity tolerance, decreased balance, decreased endurance, decreased mobility, difficulty walking, decreased ROM, decreased strength, increased edema, impaired perceived functional ability, improper body mechanics, postural dysfunction, and pain.   ACTIVITY LIMITATIONS: carrying, lifting, bending, sitting, standing, squatting, stairs, transfers, bathing, and dressing  PARTICIPATION LIMITATIONS: meal prep, cleaning, laundry, driving, community activity, and yard work  PERSONAL FACTORS: N/A are also affecting patient's functional outcome.   REHAB POTENTIAL: Good  CLINICAL DECISION MAKING: Stable/uncomplicated  EVALUATION COMPLEXITY: Low   GOALS: Goals reviewed with patient? No  SHORT TERM GOALS: Target date: 12/26/23 Patient will be independent with performance of HEP to demonstrate adequate self management  of symptoms.  Baseline:  Goal status: in progress  2.   Patient will report at least a 25% improvement with function and/or pain reduction overall since beginning PT. Baseline:  Goal status: in progress   LONG TERM GOALS: Target date: 01/23/23 Patient will improve LEFS score by 9 points to demonstrate improved perceived function while meeting MCID.  Baseline: Goal status: in progress 2.  Patient will improve  R knee ROM to at least 120 degrees flexion to demonstrate improved LE mobility needed for functional transfers and gait mechanics.  Baseline:  Goal status: in progress 3.  Patient will improve  R knee ROM to at least 0 degrees extension to demonstrate improved LE mobility needed for functional transfers and gait mechanics.  Baseline:  Goal status: in progress 4.  Patient will improve 5 times sit to stand test by at least 10 seconds to demonstrate improved LE strength/power needed for prolonged standing required for iADLs.  Baseline:  Goal status: in progress    5.  Patient will increase 2 minute walk test gait distance to 40 ft with least restrictive assistive device to demonstrate improved endurance and functional mobility needed for ambulating household and community distances.  Baseline: Goal status: in progress    PLAN:  PT FREQUENCY: 2x/week  PT DURATION: 8 weeks  PLANNED INTERVENTIONS: 97164- PT Re-evaluation, 97110-Therapeutic exercises, 97530- Therapeutic activity, 97112- Neuromuscular re-education, 97535- Self Care, 02859- Manual therapy, [phone]- Gait training, [phone]- Electrical stimulation (manual), [id number]- Ultrasound, 02987- Traction (mechanical), [phone] (1-2 muscles), 20561 (3+ muscles)- Dry Needling, Patient/Family education, Balance training, Stair training, Taping, Joint mobilization, Spinal mobilization, Scar mobilization, Cryotherapy, and Moist heat  PLAN FOR NEXT SESSION:  HEP - and DVT signs, progress LE mobility and strengthening, follow up on CPM machine (if  pt contacted representative to come adjust at his home)   1:32 PM, 12/06/23 Ritaj Dullea Small Korianna Washer MPT  physical therapy [place] [phone] Ph:[phone]   [employer] Auth Request Treatment Start Date: 11/28/23  Referring diagnosis code (ICD 10)?  S03.348 (ICD-10-CM) - Presence of right artificial knee joint   Treatment diagnosis codes (ICD 10)? (if different than referring diagnosis)  M25.661 M25.561 M62.81 Z74.09  What was this (referring dx) caused by? [x]  Surgery []  Fall []  Ongoing issue []  Arthritis []  Other: ____________  Laterality: [x]  Rt []  Lt []  Both  Deficits: [x]  Pain [x]  Stiffness [x]  Weakness [x]  Edema [x]  Balance Deficits [x]  Coordination [x]  Gait Disturbance [x]  ROM []  Other   Functional Tool Score: Lower Extremity Functional Score: 4 / 80 = 5.0 % 5 times sit to stand: 41 seconds   CPT codes: See Planned Interventions listed in the Plan section of the Evaluation.

## 2023-12-07 DIAGNOSIS — K5903 Drug induced constipation: Secondary | ICD-10-CM | POA: Diagnosis not present

## 2023-12-07 DIAGNOSIS — R2241 Localized swelling, mass and lump, right lower limb: Secondary | ICD-10-CM | POA: Diagnosis not present

## 2023-12-07 DIAGNOSIS — M79651 Pain in right thigh: Secondary | ICD-10-CM | POA: Diagnosis not present

## 2023-12-07 DIAGNOSIS — M79671 Pain in right foot: Secondary | ICD-10-CM | POA: Diagnosis not present

## 2023-12-07 DIAGNOSIS — Z96652 Presence of left artificial knee joint: Secondary | ICD-10-CM | POA: Diagnosis not present

## 2023-12-08 ENCOUNTER — Ambulatory Visit (HOSPITAL_COMMUNITY): Admitting: Physical Therapy

## 2023-12-14 ENCOUNTER — Ambulatory Visit (HOSPITAL_COMMUNITY): Admitting: Physical Therapy

## 2023-12-14 DIAGNOSIS — M6281 Muscle weakness (generalized): Secondary | ICD-10-CM

## 2023-12-14 DIAGNOSIS — M25561 Pain in right knee: Secondary | ICD-10-CM

## 2023-12-14 DIAGNOSIS — M25661 Stiffness of right knee, not elsewhere classified: Secondary | ICD-10-CM | POA: Diagnosis not present

## 2023-12-14 DIAGNOSIS — Z7409 Other reduced mobility: Secondary | ICD-10-CM

## 2023-12-14 NOTE — Therapy (Signed)
 OUTPATIENT PHYSICAL THERAPY LOWER EXTREMITY TREATMENT   Patient Name: Erik Wright MRN: [id number] DOB:February 27, 1955, 68 y.o., male Today's Date: 12/14/2023  END OF SESSION:  PT End of Session - 12/14/23 1302     Visit Number 3    Number of Visits 16    Date for Recertification  12/26/23    Authorization Type [employer]    Authorization Time Period Auth requested    Progress Note Due on Visit 10    PT Start Time 1248    PT Stop Time 1329    PT Time Calculation (min) 41 min    Activity Tolerance Patient tolerated treatment well;No increased pain    Behavior During Therapy WFL for tasks assessed/performed          Past Medical History:  Diagnosis Date   Anxiety    Arthritis    Chronic venous insufficiency    Headache    occ. migraine   History of gout    Hypercholesteremia    Hypertension    Past Surgical History:  Procedure Laterality Date   BACK SURGERY     lumbar disc   KNEE ARTHROSCOPY WITH MEDIAL MENISECTOMY Right 02/09/2013   Procedure: KNEE ARTHROSCOPY WITH MEDIAL MENISECTOMY with limited debridement;  Surgeon: Taft FORBES Minerva, MD;  Location: [facility];  Service: Orthopedics;  Laterality: Right;   LAPAROSCOPIC GASTRIC BANDING     TOTAL KNEE ARTHROPLASTY Right 11/22/2023   Procedure: ARTHROPLASTY, KNEE, TOTAL;  Surgeon: Beverley Evalene BIRCH, MD;  Location: [facility];  Service: Orthopedics;  Laterality: Right;   Patient Active Problem List   Diagnosis Date Noted   S/P total knee arthroplasty, right 11/22/2023   Derangement of posterior horn of medial meniscus 02/01/2013   Back pain without radiation 12/20/2012   Osteoarthritis of right knee 12/20/2012   Left ankle pain 12/20/2012   Medial meniscus, posterior horn derangement 12/20/2012   Knee pain 12/20/2012   Fractured great toe 06/26/2012   Constipation 06/26/2012    PCP: Sheryle Carwin, MD  REFERRING PROVIDER: Beverley Evalene BIRCH, MD  REFERRING DIAG:  [phone] (ICD-10-CM) - Presence of right artificial  knee joint   THERAPY DIAG:  Decreased range of motion (ROM) of right knee  Acute pain of right knee  Impaired functional mobility, balance, gait, and endurance  Muscle weakness  Rationale for Evaluation and Treatment: Rehabilitation  ONSET DATE: 11/22/23  SUBJECTIVE:   SUBJECTIVE STATEMENT: Pt reports he is doing well, still some soreness in LE.  No AD today.  EVAL:Patient reports he did not have any home therapy. Surgery was on 11/22/23. Reports he has CPM machine but he is unable to get foot into machine. Technician came before surgery to help set up machine.   PERTINENT HISTORY: Anxiety Arthritis History of gout Back pain   PAIN:  Are you having pain? Yes: NPRS scale: 9/10 Pain location: R knee Pain description: Constant ache Aggravating factors: Any motion and activity Relieving factors: Pain Medication   PRECAUTIONS: None  RED FLAGS: None  Hasn't had a BM in 6-7 days, encouraged to contact MD  WEIGHT BEARING RESTRICTIONS: No  FALLS:  Has patient fallen in last 6 months? No  LIVING ENVIRONMENT: Stairs: No Has following equipment at home: Single point cane, Walker - 2 wheeled, Wheelchair (manual), shower chair, and bed side commode, walk in shower  OCCUPATION: Retired   PLOF: Needs assistance with ADLs  PATIENT GOALS: To be able to walk pain free  NEXT MD VISIT: One week, 12/06/23  OBJECTIVE:  Note: Objective measures were completed at Evaluation unless otherwise noted.  DIAGNOSTIC FINDINGS:    PATIENT SURVEYS:  LEFS  Extreme difficulty/unable (0), Quite a bit of difficulty (1), Moderate difficulty (2), Little difficulty (3), No difficulty (4) Survey date:    Any of your usual work, housework or school activities   2. Usual hobbies, recreational or sporting activities   3. Getting into/out of the bath   4. Walking between rooms   5. Putting on socks/shoes   6. Squatting    7. Lifting an object, like a bag of groceries from the floor   8.  Performing light activities around your home   9. Performing heavy activities around your home   10. Getting into/out of a car   11. Walking 2 blocks   12. Walking 1 mile   13. Going up/down 10 stairs (1 flight)   14. Standing for 1 hour   15.  sitting for 1 hour   16. Running on even ground   17. Running on uneven ground   18. Making sharp turns while running fast   19. Hopping    20. Rolling over in bed   Score total:  Lower Extremity Functional Score: 4 / 80 = 5.0 %      COGNITION: Overall cognitive status: Within functional limits for tasks assessed   SENSATION: WFL  EDEMA:  Mild edema in R knee, bruising around dressing (typical for post-op) and thigh where tourniquet was applied   POSTURE: rounded shoulders, forward head, and increased thoracic kyphosis  PALPATION: TTP lateral aspect of R knee joint TTP inferior aspect just along outline of dressing   LOWER EXTREMITY ROM:  Active ROM Right eval Left eval Right 12/06/23 Right 12/10 AROM  Hip flexion      Hip extension      Hip abduction      Hip adduction      Hip internal rotation      Hip external rotation      Knee flexion 67  95 110  Knee extension -21  -8 --5  Ankle dorsiflexion      Ankle plantarflexion      Ankle inversion      Ankle eversion       (Blank rows = not tested)    LOWER EXTREMITY MMT:  MMT Right eval Left eval Right 12/14/23 Left 12/14/23  Hip flexion      Hip extension      Hip abduction      Hip adduction      Hip internal rotation      Hip external rotation      Knee flexion      Knee extension   30# dynamometer 50# dynamometer  Ankle dorsiflexion 5 (dec AROM of note) 5    Ankle plantarflexion      Ankle inversion      Ankle eversion       (Blank rows = not tested)  LOWER EXTREMITY SPECIAL TESTS:    FUNCTIONAL TESTS:  5 times sit to stand: 41 seconds, R foot forward, BUE push off 2 minute walk test: Next Session   GAIT: Distance walked: 50 ft in  session Assistive device utilized: Walker - 2 wheeled Level of assistance: Modified independence Comments: Pt ambulates in session with RW, dec knee flex/ext with RLE, Dec stance time on R, dec velocity  TREATMENT DATE:  12/14/23 Bike seat 16 full revolutions 5 minutes Standing:  SB stre tch 3X20  Knee flexion drives onto 12 10X10  Knee flexion curls 2X10  Hip abduction 10X each LE  Hip extensions 10X each LE Supine:  quad sets 10X5  AROM and PROM for knee extension (-5, -2)  Knee flexion, heelslides AROM 110 Seated MMT using dynamometer (see above graph)    12/06/23 Review HEP and goals 2 MWT 315 ft carrying SPC 12 box right knee drives for flexion x 2' Standing toe raises 2 x 10 Slant board 3 x 20 Supine: Quad sets 5 x 10 Heel slides x 10 Seated heel slides x 10 AROM right knee 95 Bike seat 16 x 5' half revolutions  11/28/23: PT Eval and HEP    PATIENT EDUCATION:  Education details: PT evaluation, objective findings, POC, Importance of HEP, Precautions, Clinic policies  Person educated: Patient and Spouse Education method: Explanation and Demonstration Education comprehension: verbalized understanding and returned demonstration  HOME EXERCISE PROGRAM: Access Code: [email or web address] URL: [email or web address] Date: 11/28/2023 Prepared by: Rosaria Powell-Butler  Exercises - Quad Setting and Stretching  - 2 x daily - 7 x weekly - 2 sets - 10 reps - 10 hold - Supine Heel Slide with Strap  - 2 x daily - 7 x weekly - 2 sets - 10 reps - 10 hold - Seated Long Arc Quad  - 2 x daily - 7 x weekly - 2 sets - 10 reps - Seated Knee Flexion Stretch  - 2 x daily - 7 x weekly - 2 sets - 10 reps - 10 hold - Sit to Stand  - 2 x daily - 7 x weekly - 2 sets - 10 reps  ASSESSMENT:  CLINICAL IMPRESSION: Pt comes today without AD with slight  antalgic gait.  Bandage now off and incision has healed nicely. Began on bike with ability to make full revolutions.  Continued with standing ROM exericses for Lt LE and began static strengthening.  Pt required max cues to maintain trunk stability with hip exercises.  Pt can only maintain briefly before returning to inability to stabilize.  ROM taken with noted improvement today at end of session, 5-110.  Some tenderness presists but slowly improving.  Discussed importance of wearing compression socks as pt had mentioned his venous insufficiency in his legs. Pt continues to improve.   Patient will benefit from continued skilled therapy services to address deficits and promote return to optimal function.     Eval:Patient is a 68 y.o. male who was seen today for physical therapy evaluation and treatment for  Z96.651 (ICD-10-CM) - Presence of right artificial knee joint  . On this date, patient demonstrates impaired self perception of function, decreased R knee ROM/mobility, decreased LE strength, decreased endurance, and impaired balance, all of which may be contributing to patient's increased pain, decreased activity tolerance, altered gait pattern, and impairing their overall function. Educated patient and significant other on signs on DVT/blood clots, contacting technician/support for CPM machine set up, and importance of walking program (beginning at 10 minutes a day) and HEP compliance. Patient reports understanding. Patient will benefit from skilled physical therapy to address the above/below deficits in order to improve pain and overall function.     OBJECTIVE IMPAIRMENTS: Abnormal gait, cardiopulmonary status limiting activity, decreased activity tolerance, decreased balance, decreased endurance, decreased mobility, difficulty walking, decreased ROM, decreased strength, increased edema, impaired perceived functional ability, improper body mechanics, postural dysfunction, and pain.   ACTIVITY  LIMITATIONS: carrying, lifting, bending, sitting, standing, squatting, stairs, transfers, bathing, and dressing  PARTICIPATION LIMITATIONS: meal prep, cleaning, laundry, driving, community activity, and yard work  PERSONAL FACTORS: N/A are also affecting patient's functional outcome.   REHAB POTENTIAL: Good  CLINICAL DECISION MAKING: Stable/uncomplicated  EVALUATION COMPLEXITY: Low   GOALS: Goals reviewed with patient? No  SHORT TERM GOALS: Target date: 12/26/23 Patient will be independent with performance of HEP to demonstrate adequate self management of symptoms.  Baseline:  Goal status: in progress  2.   Patient will report at least a 25% improvement with function and/or pain reduction overall since beginning PT. Baseline:  Goal status: in progress   LONG TERM GOALS: Target date: 01/23/23 Patient will improve LEFS score by 9 points to demonstrate improved perceived function while meeting MCID.  Baseline: Goal status: in progress 2.  Patient will improve  R knee ROM to at least 120 degrees flexion to demonstrate improved LE mobility needed for functional transfers and gait mechanics.  Baseline:  Goal status: in progress 3.  Patient will improve  R knee ROM to at least 0 degrees extension to demonstrate improved LE mobility needed for functional transfers and gait mechanics.  Baseline:  Goal status: in progress 4.  Patient will improve 5 times sit to stand test by at least 10 seconds to demonstrate improved LE strength/power needed for prolonged standing required for iADLs.  Baseline:  Goal status: in progress    5.  Patient will increase 2 minute walk test gait distance to 40 ft with least restrictive assistive device to demonstrate improved endurance and functional mobility needed for ambulating household and community distances.  Baseline: Goal status: in progress    PLAN:  PT FREQUENCY: 2x/week  PT DURATION: 8 weeks  PLANNED INTERVENTIONS: [id number]- PT  Re-evaluation, 97110-Therapeutic exercises, 97530- Therapeutic activity, [id number]- Neuromuscular re-education, 97535- Self Care, [id number]- Manual therapy, [id number]- Gait training, [phone]- Electrical stimulation (manual), [id number]- Ultrasound, [id number]- Traction (mechanical), [phone] (1-2 muscles), [id number] (3+ muscles)- Dry Needling, Patient/Family education, Balance training, Stair training, Taping, Joint mobilization, Spinal mobilization, Scar mobilization, Cryotherapy, and Moist heat  PLAN FOR NEXT SESSION:  Continue with improvements on Rt knee ROM then strengthening/stabilization.  Keep check of DVT signs.  Follow up on use of compression (would need thigh high).   1:03 PM, 12/14/23 Greig KATHEE Fuse, PTA/CLT [facility] Ph: [phone]

## 2023-12-16 ENCOUNTER — Encounter (HOSPITAL_COMMUNITY): Payer: Self-pay

## 2023-12-16 ENCOUNTER — Ambulatory Visit (HOSPITAL_COMMUNITY)

## 2023-12-16 DIAGNOSIS — M25561 Pain in right knee: Secondary | ICD-10-CM

## 2023-12-16 DIAGNOSIS — M25661 Stiffness of right knee, not elsewhere classified: Secondary | ICD-10-CM

## 2023-12-16 DIAGNOSIS — M6281 Muscle weakness (generalized): Secondary | ICD-10-CM

## 2023-12-16 DIAGNOSIS — Z7409 Other reduced mobility: Secondary | ICD-10-CM

## 2023-12-16 NOTE — Therapy (Addendum)
 OUTPATIENT PHYSICAL THERAPY LOWER EXTREMITY TREATMENT   Patient Name: Erik Wright MRN: [id number] DOB:08-15-1955, 68 y.o., male Today's Date: 12/16/2023  END OF SESSION:  PT End of Session - 12/16/23 1244     Visit Number 4    Number of Visits 16    Date for Recertification  12/26/23    Authorization Type [employer]    Authorization Time Period cohere approved 16 visits from 11/28/23-02/26/24    Authorization - Visit Number 4    Authorization - Number of Visits 16    Progress Note Due on Visit 10    PT Start Time 1249    PT Stop Time 1328    PT Time Calculation (min) 39 min    Activity Tolerance Patient tolerated treatment well;No increased pain    Behavior During Therapy WFL for tasks assessed/performed          Past Medical History:  Diagnosis Date   Anxiety    Arthritis    Chronic venous insufficiency    Headache    occ. migraine   History of gout    Hypercholesteremia    Hypertension    Past Surgical History:  Procedure Laterality Date   BACK SURGERY     lumbar disc   KNEE ARTHROSCOPY WITH MEDIAL MENISECTOMY Right 02/09/2013   Procedure: KNEE ARTHROSCOPY WITH MEDIAL MENISECTOMY with limited debridement;  Surgeon: Taft FORBES Minerva, MD;  Location: [facility];  Service: Orthopedics;  Laterality: Right;   LAPAROSCOPIC GASTRIC BANDING     TOTAL KNEE ARTHROPLASTY Right 11/22/2023   Procedure: ARTHROPLASTY, KNEE, TOTAL;  Surgeon: Beverley Evalene BIRCH, MD;  Location: [facility];  Service: Orthopedics;  Laterality: Right;   Patient Active Problem List   Diagnosis Date Noted   S/P total knee arthroplasty, right 11/22/2023   Derangement of posterior horn of medial meniscus 02/01/2013   Back pain without radiation 12/20/2012   Osteoarthritis of right knee 12/20/2012   Left ankle pain 12/20/2012   Medial meniscus, posterior horn derangement 12/20/2012   Knee pain 12/20/2012   Fractured great toe 06/26/2012   Constipation 06/26/2012    PCP: Sheryle Carwin, MD  REFERRING  PROVIDER: Beverley Evalene BIRCH, MD  REFERRING DIAG:  [phone] (ICD-10-CM) - Presence of right artificial knee joint   THERAPY DIAG:  Decreased range of motion (ROM) of right knee  Acute pain of right knee  Impaired functional mobility, balance, gait, and endurance  Muscle weakness  Rationale for Evaluation and Treatment: Rehabilitation  ONSET DATE: 11/22/23  SUBJECTIVE:   SUBJECTIVE STATEMENT: Pt reports increased pain following PROM last session, current pain scale 6/10 Rt knee.  Continues to have pain in calf, has not noticed any redness or heat to area.    EVAL:Patient reports he did not have any home therapy. Surgery was on 11/22/23. Reports he has CPM machine but he is unable to get foot into machine. Technician came before surgery to help set up machine.   PERTINENT HISTORY: Anxiety Arthritis History of gout Back pain   PAIN:  Are you having pain? Yes: NPRS scale: 9/10 Pain location: R knee Pain description: Constant ache Aggravating factors: Any motion and activity Relieving factors: Pain Medication   PRECAUTIONS: None  RED FLAGS: None  Hasn't had a BM in 6-7 days, encouraged to contact MD  WEIGHT BEARING RESTRICTIONS: No  FALLS:  Has patient fallen in last 6 months? No  LIVING ENVIRONMENT: Stairs: No Has following equipment at home: Single point cane, Walker - 2 wheeled, Wheelchair (manual), shower  chair, and bed side commode, walk in shower  OCCUPATION: Retired   PLOF: Needs assistance with ADLs  PATIENT GOALS: To be able to walk pain free  NEXT MD VISIT: End of December, start of January  OBJECTIVE:  Note: Objective measures were completed at Evaluation unless otherwise noted.  DIAGNOSTIC FINDINGS:    PATIENT SURVEYS:  LEFS  Extreme difficulty/unable (0), Quite a bit of difficulty (1), Moderate difficulty (2), Little difficulty (3), No difficulty (4) Survey date:    Any of your usual work, housework or school activities   2. Usual  hobbies, recreational or sporting activities   3. Getting into/out of the bath   4. Walking between rooms   5. Putting on socks/shoes   6. Squatting    7. Lifting an object, like a bag of groceries from the floor   8. Performing light activities around your home   9. Performing heavy activities around your home   10. Getting into/out of a car   11. Walking 2 blocks   12. Walking 1 mile   13. Going up/down 10 stairs (1 flight)   14. Standing for 1 hour   15.  sitting for 1 hour   16. Running on even ground   17. Running on uneven ground   18. Making sharp turns while running fast   19. Hopping    20. Rolling over in bed   Score total:  Lower Extremity Functional Score: 4 / 80 = 5.0 %      COGNITION: Overall cognitive status: Within functional limits for tasks assessed   SENSATION: WFL  EDEMA:  Mild edema in R knee, bruising around dressing (typical for post-op) and thigh where tourniquet was applied   12/16/23:Homan's test- no heat, redness, pain present, no increased pain with squeeze.  POSTURE: rounded shoulders, forward head, and increased thoracic kyphosis  PALPATION: TTP lateral aspect of R knee joint TTP inferior aspect just along outline of dressing   LOWER EXTREMITY ROM:  Active ROM Right eval Left eval Right 12/06/23 Right 12/10 AROM  Hip flexion      Hip extension      Hip abduction      Hip adduction      Hip internal rotation      Hip external rotation      Knee flexion 67  95 110  Knee extension -21  -8 --5  Ankle dorsiflexion      Ankle plantarflexion      Ankle inversion      Ankle eversion       (Blank rows = not tested)    LOWER EXTREMITY MMT:  MMT Right eval Left eval Right 12/14/23 Left 12/14/23  Hip flexion      Hip extension      Hip abduction      Hip adduction      Hip internal rotation      Hip external rotation      Knee flexion      Knee extension   30# dynamometer 50# dynamometer  Ankle dorsiflexion 5 (dec AROM  of note) 5    Ankle plantarflexion      Ankle inversion      Ankle eversion       (Blank rows = not tested)  LOWER EXTREMITY SPECIAL TESTS:    FUNCTIONAL TESTS:  5 times sit to stand: 41 seconds, R foot forward, BUE push off 2 minute walk test: 12/06/23: 2 MWT 315 ft carrying SPC  GAIT: Distance walked:  50 ft in session Assistive device utilized: Walker - 2 wheeled Level of assistance: Modified independence Comments: Pt ambulates in session with RW, dec knee flex/ext with RLE, Dec stance time on R, dec velocity                                                                                                                                TREATMENT DATE:  12/16/23: Bike seat 16 rocking.  Homan's test- no heat, redness, pain present, no increased pain with squeeze. Standing:  Knee drives for flexion on 12in step 10x 10 Prone:   Prone knee hang x 2 min  Quad stretch 3x 30 Supine:  SAQ 10x5   Heel slides 10x  AROM 5-105 degrees  Hamstring stretch with rope 3x 30  Standing:  Rockerboard Lateral movements x 1' Gait training heel to toe mechanics and equal stride length.  X 224ft  Discussed benefits of compression garments, pt declined measurements, stated his edema is minimal.  Verbalized understanding of benefits  12/14/23 Bike seat 16 full revolutions 5 minutes Standing:  SB stre tch 3X20  Knee flexion drives onto 12 10X10  Knee flexion curls 2X10  Hip abduction 10X each LE  Hip extensions 10X each LE Supine:  quad sets 10X5  AROM and PROM for knee extension (-5, -2)  Knee flexion, heelslides AROM 110 Seated MMT using dynamometer (see above graph)    12/06/23 Review HEP and goals 2 MWT 315 ft carrying SPC 12 box right knee drives for flexion x 2' Standing toe raises 2 x 10 Slant board 3 x 20 Supine: Quad sets 5 x 10 Heel slides x 10 Seated heel slides x 10 AROM right knee 95 Bike seat 16 x 5' half revolutions  11/28/23: PT Eval and HEP     PATIENT EDUCATION:  Education details: PT evaluation, objective findings, POC, Importance of HEP, Precautions, Clinic policies  Person educated: Patient and Spouse Education method: Explanation and Demonstration Education comprehension: verbalized understanding and returned demonstration  HOME EXERCISE PROGRAM: Access Code: [email or web address] URL: [email or web address] Date: 11/28/2023 Prepared by: Rosaria Powell-Butler  Exercises - Quad Setting and Stretching  - 2 x daily - 7 x weekly - 2 sets - 10 reps - 10 hold - Supine Heel Slide with Strap  - 2 x daily - 7 x weekly - 2 sets - 10 reps - 10 hold - Seated Long Arc Quad  - 2 x daily - 7 x weekly - 2 sets - 10 reps - Seated Knee Flexion Stretch  - 2 x daily - 7 x weekly - 2 sets - 10 reps - 10 hold - Sit to Stand  - 2 x daily - 7 x weekly - 2 sets - 10 reps  ASSESSMENT:  CLINICAL IMPRESSION: Pt limited with increased pain in knee and continues with have pain in Rt calf.  Homan's test complete with no increased pain with squeeze, no heat or redness present.  Pt with limited knee mobility compared to last session due to pain. AROM 5-105 degrees.  Added prone knee hang and hamstring/calf and quad stretches for mobility.  EOS focus with gait training to improve heel to toe mechanics and equal stride length.  Educated benefits with compression for edema control and venous insufficiency, pt declined ETI information this session though verbalized understanding of benefits wearing.  EOS pt reports pain reduce to 3-4/10.  Reviewed RICE techqniues for pain and edema control following session.  Eval:Patient is a 68 y.o. male who was seen today for physical therapy evaluation and treatment for  Z96.651 (ICD-10-CM) - Presence of right artificial knee joint  . On this date, patient demonstrates impaired self perception of function, decreased R knee ROM/mobility, decreased LE strength, decreased endurance, and impaired balance, all of which may be  contributing to patient's increased pain, decreased activity tolerance, altered gait pattern, and impairing their overall function. Educated patient and significant other on signs on DVT/blood clots, contacting technician/support for CPM machine set up, and importance of walking program (beginning at 10 minutes a day) and HEP compliance. Patient reports understanding. Patient will benefit from skilled physical therapy to address the above/below deficits in order to improve pain and overall function.     OBJECTIVE IMPAIRMENTS: Abnormal gait, cardiopulmonary status limiting activity, decreased activity tolerance, decreased balance, decreased endurance, decreased mobility, difficulty walking, decreased ROM, decreased strength, increased edema, impaired perceived functional ability, improper body mechanics, postural dysfunction, and pain.   ACTIVITY LIMITATIONS: carrying, lifting, bending, sitting, standing, squatting, stairs, transfers, bathing, and dressing  PARTICIPATION LIMITATIONS: meal prep, cleaning, laundry, driving, community activity, and yard work  PERSONAL FACTORS: N/A are also affecting patient's functional outcome.   REHAB POTENTIAL: Good  CLINICAL DECISION MAKING: Stable/uncomplicated  EVALUATION COMPLEXITY: Low   GOALS: Goals reviewed with patient? No  SHORT TERM GOALS: Target date: 12/26/23 Patient will be independent with performance of HEP to demonstrate adequate self management of symptoms.  Baseline:  Goal status: in progress  2.   Patient will report at least a 25% improvement with function and/or pain reduction overall since beginning PT. Baseline:  Goal status: in progress   LONG TERM GOALS: Target date: 01/23/23 Patient will improve LEFS score by 9 points to demonstrate improved perceived function while meeting MCID.  Baseline: Goal status: in progress 2.  Patient will improve  R knee ROM to at least 120 degrees flexion to demonstrate improved LE mobility  needed for functional transfers and gait mechanics.  Baseline:  Goal status: in progress 3.  Patient will improve  R knee ROM to at least 0 degrees extension to demonstrate improved LE mobility needed for functional transfers and gait mechanics.  Baseline:  Goal status: in progress 4.  Patient will improve 5 times sit to stand test by at least 10 seconds to demonstrate improved LE strength/power needed for prolonged standing required for iADLs.  Baseline:  Goal status: in progress    5.  Patient will increase 2 minute walk test gait distance to 40 ft with least restrictive assistive device to demonstrate improved endurance and functional mobility needed for ambulating household and community distances.  Baseline: Goal status: in progress    PLAN:  PT FREQUENCY: 2x/week  PT DURATION: 8 weeks  PLANNED INTERVENTIONS: 97164- PT Re-evaluation, 97110-Therapeutic exercises, 97530- Therapeutic activity, [id number]- Neuromuscular re-education, 97535- Self Care, 02859- Manual therapy, [id number]- Gait training, [phone]- Electrical stimulation (manual), [id number]- Ultrasound, 02987- Traction (mechanical), [phone] (1-2 muscles), 20561 (3+ muscles)- Dry Needling, Patient/Family education,  Balance training, Stair training, Taping, Joint mobilization, Spinal mobilization, Scar mobilization, Cryotherapy, and Moist heat  PLAN FOR NEXT SESSION:  Continue with improvements on Rt knee ROM then strengthening/stabilization.  Keep check of DVT signs.  Follow up on use of compression (would need thigh high).   Augustin Mclean, LPTA/CLT; CBIS [phone]  2:55 PM, 12/16/2023

## 2023-12-20 ENCOUNTER — Ambulatory Visit (HOSPITAL_COMMUNITY): Admitting: Physical Therapy

## 2023-12-20 DIAGNOSIS — Z7409 Other reduced mobility: Secondary | ICD-10-CM

## 2023-12-20 DIAGNOSIS — M25561 Pain in right knee: Secondary | ICD-10-CM

## 2023-12-20 DIAGNOSIS — M25661 Stiffness of right knee, not elsewhere classified: Secondary | ICD-10-CM

## 2023-12-20 DIAGNOSIS — M6281 Muscle weakness (generalized): Secondary | ICD-10-CM

## 2023-12-20 NOTE — Therapy (Signed)
 OUTPATIENT PHYSICAL THERAPY LOWER EXTREMITY TREATMENT   Patient Name: Erik Wright MRN: [id number] DOB:1955-02-10, 68 y.o., male Today's Date: 12/20/2023  END OF SESSION:  PT End of Session - 12/20/23 1131     Visit Number 5    Number of Visits 16    Date for Recertification  12/26/23    Authorization Type [employer]    Authorization Time Period cohere approved 16 visits from 11/28/23-02/26/24    Authorization - Visit Number 5    Authorization - Number of Visits 16    Progress Note Due on Visit 10    PT Start Time 1033    PT Stop Time 1115    PT Time Calculation (min) 42 min    Activity Tolerance Patient tolerated treatment well;No increased pain    Behavior During Therapy WFL for tasks assessed/performed           Past Medical History:  Diagnosis Date   Anxiety    Arthritis    Chronic venous insufficiency    Headache    occ. migraine   History of gout    Hypercholesteremia    Hypertension    Past Surgical History:  Procedure Laterality Date   BACK SURGERY     lumbar disc   KNEE ARTHROSCOPY WITH MEDIAL MENISECTOMY Right 02/09/2013   Procedure: KNEE ARTHROSCOPY WITH MEDIAL MENISECTOMY with limited debridement;  Surgeon: Taft FORBES Minerva, MD;  Location: [facility];  Service: Orthopedics;  Laterality: Right;   LAPAROSCOPIC GASTRIC BANDING     TOTAL KNEE ARTHROPLASTY Right 11/22/2023   Procedure: ARTHROPLASTY, KNEE, TOTAL;  Surgeon: Beverley Evalene BIRCH, MD;  Location: [facility];  Service: Orthopedics;  Laterality: Right;   Patient Active Problem List   Diagnosis Date Noted   S/P total knee arthroplasty, right 11/22/2023   Derangement of posterior horn of medial meniscus 02/01/2013   Back pain without radiation 12/20/2012   Osteoarthritis of right knee 12/20/2012   Left ankle pain 12/20/2012   Medial meniscus, posterior horn derangement 12/20/2012   Knee pain 12/20/2012   Fractured great toe 06/26/2012   Constipation 06/26/2012    PCP: Sheryle Carwin,  MD  REFERRING PROVIDER: Beverley Evalene BIRCH, MD  REFERRING DIAG:  [id number] (ICD-10-CM) - Presence of right artificial knee joint   THERAPY DIAG:  Decreased range of motion (ROM) of right knee  Acute pain of right knee  Impaired functional mobility, balance, gait, and endurance  Muscle weakness  Rationale for Evaluation and Treatment: Rehabilitation  ONSET DATE: 11/22/23  SUBJECTIVE:   SUBJECTIVE STATEMENT: Pt states his Rt calf remains tight.  States had increased soreness for a couple days after last session due to strength testing of quads.  Doing much better today and feels he's bending his knee better.  5/10   EVAL:Patient reports he did not have any home therapy. Surgery was on 11/22/23. Reports he has CPM machine but he is unable to get foot into machine. Technician came before surgery to help set up machine.   PERTINENT HISTORY: Anxiety Arthritis History of gout Back pain   PAIN:  Are you having pain? Yes: NPRS scale: 5/10 Pain location: R knee Pain description: Constant ache Aggravating factors: Any motion and activity Relieving factors: Pain Medication   PRECAUTIONS: None  RED FLAGS: None  Hasn't had a BM in 6-7 days, encouraged to contact MD  WEIGHT BEARING RESTRICTIONS: No  FALLS:  Has patient fallen in last 6 months? No  LIVING ENVIRONMENT: Stairs: No Has following equipment at home: Single  point cane, Walker - 2 wheeled, Wheelchair (manual), shower chair, and bed side commode, walk in shower  OCCUPATION: Retired   PLOF: Needs assistance with ADLs  PATIENT GOALS: To be able to walk pain free  NEXT MD VISIT: End of December, start of January  OBJECTIVE:  Note: Objective measures were completed at Evaluation unless otherwise noted.  DIAGNOSTIC FINDINGS:    PATIENT SURVEYS:  LEFS  Extreme difficulty/unable (0), Quite a bit of difficulty (1), Moderate difficulty (2), Little difficulty (3), No difficulty (4) Survey date:   evaluation   Score total:  Lower Extremity Functional Score: 4 / 80 = 5.0 %      COGNITION: Overall cognitive status: Within functional limits for tasks assessed   SENSATION: WFL  EDEMA:  Mild edema in R knee, bruising around dressing (typical for post-op) and thigh where tourniquet was applied   12/16/23:Homan's test- no heat, redness, pain present, no increased pain with squeeze.  POSTURE: rounded shoulders, forward head, and increased thoracic kyphosis  PALPATION: TTP lateral aspect of R knee joint TTP inferior aspect just along outline of dressing   LOWER EXTREMITY ROM:  Active ROM Right eval Left eval Right 12/06/23 Right 12/10 AROM Right 12/16  Hip flexion       Hip extension       Hip abduction       Hip adduction       Hip internal rotation       Hip external rotation       Knee flexion 67  95 110 118  Knee extension -21  -8 --5 -5  Ankle dorsiflexion       Ankle plantarflexion       Ankle inversion       Ankle eversion        (Blank rows = not tested)    LOWER EXTREMITY MMT:  MMT Right eval Left eval Right 12/14/23 Left 12/14/23  Hip flexion      Hip extension      Hip abduction      Hip adduction      Hip internal rotation      Hip external rotation      Knee flexion      Knee extension   30# dynamometer 50# dynamometer  Ankle dorsiflexion 5 (dec AROM of note) 5    Ankle plantarflexion      Ankle inversion      Ankle eversion       (Blank rows = not tested)  LOWER EXTREMITY SPECIAL TESTS:    FUNCTIONAL TESTS:  5 times sit to stand: 41 seconds, R foot forward, BUE push off 2 minute walk test: 12/06/23: 2 MWT 315 ft carrying SPC  GAIT: Distance walked: 50 ft in session Assistive device utilized: Walker - 2 wheeled Level of assistance: Modified independence Comments: Pt ambulates in session with RW, dec knee flex/ext with RLE, Dec stance time on R, dec velocity  TREATMENT DATE:  12/20/23 Bike seat 16 full revolutions 5 minutes Standing:  knee flexion stretch 12 step 10X10  Hamstring curls 15X  Heelraises 15X  Gastroc stretch against wall 30 Rt only  4 forward step ups, Rt leading 10X bil UE assist Stair negotiation 4 steps ascending reciprocally 1 HR, descending reciprocally 2HR's Supine:  quad sets 10X5  SAQ 10X5  Heelslides 10X AROM -5 to 118   12/16/23: Bike seat 16 rocking.  Homan's test- no heat, redness, pain present, no increased pain with squeeze. Standing:  Knee drives for flexion on 12in step 10x 10 Prone:   Prone knee hang x 2 min  Quad stretch 3x 30 Supine:  SAQ 10x5   Heel slides 10x  AROM 5-105 degrees  Hamstring stretch with rope 3x 30  Standing:  Rockerboard Lateral movements x 1' Gait training heel to toe mechanics and equal stride length.  X 265ft  Discussed benefits of compression garments, pt declined measurements, stated his edema is minimal.  Verbalized understanding of benefits  12/14/23 Bike seat 16 full revolutions 5 minutes Standing:  SB stre tch 3X20  Knee flexion drives onto 12 10X10  Knee flexion curls 2X10  Hip abduction 10X each LE  Hip extensions 10X each LE Supine:  quad sets 10X5  AROM and PROM for knee extension (-5, -2)  Knee flexion, heelslides AROM 110 Seated MMT using dynamometer (see above graph)    12/06/23 Review HEP and goals 2 MWT 315 ft carrying SPC 12 box right knee drives for flexion x 2' Standing toe raises 2 x 10 Slant board 3 x 20 Supine: Quad sets 5 x 10 Heel slides x 10 Seated heel slides x 10 AROM right knee 95 Bike seat 16 x 5' half revolutions  11/28/23: PT Eval and HEP    PATIENT EDUCATION:  Education details: PT evaluation, objective findings, POC, Importance of HEP, Precautions, Clinic policies  Person educated: Patient and Spouse Education method: Explanation and  Demonstration Education comprehension: verbalized understanding and returned demonstration  HOME EXERCISE PROGRAM: Access Code: [email or web address] URL: [email or web address] Date: 11/28/2023 Prepared by: Rosaria Powell-Butler  Exercises - Quad Setting and Stretching  - 2 x daily - 7 x weekly - 2 sets - 10 reps - 10 hold - Supine Heel Slide with Strap  - 2 x daily - 7 x weekly - 2 sets - 10 reps - 10 hold - Seated Long Arc Quad  - 2 x daily - 7 x weekly - 2 sets - 10 reps - Seated Knee Flexion Stretch  - 2 x daily - 7 x weekly - 2 sets - 10 reps - 10 hold - Sit to Stand  - 2 x daily - 7 x weekly - 2 sets - 10 reps  ASSESSMENT:  CLINICAL IMPRESSION: Pt returns today with noted improvement in ambulation, mostly c/o Rt calf tightness.  Began on bike with ability to make full revolutions today.  Began stair training with ability to ascend in reciprocal form with just 1 UE assist but having to use bil UE with descending.  Last round of descending was able to complete with 1 UE with increased time/focus but with a little antalgia.  Pt reported more fear and habit rather than pain or weakness. Manual for scar revealed overall reduction in scar tissue today.   AROM improved following to 5-118 degrees which is an 8 degree gain in flexion since last week.  Instructed with gastroc stretch against wall with pt reporting noted improvement/loosening in calf  following.    Eval:Patient is a 68 y.o. male who was seen today for physical therapy evaluation and treatment for  Z96.651 (ICD-10-CM) - Presence of right artificial knee joint  . On this date, patient demonstrates impaired self perception of function, decreased R knee ROM/mobility, decreased LE strength, decreased endurance, and impaired balance, all of which may be contributing to patient's increased pain, decreased activity tolerance, altered gait pattern, and impairing their overall function. Educated patient and significant other on signs on  DVT/blood clots, contacting technician/support for CPM machine set up, and importance of walking program (beginning at 10 minutes a day) and HEP compliance. Patient reports understanding. Patient will benefit from skilled physical therapy to address the above/below deficits in order to improve pain and overall function.     OBJECTIVE IMPAIRMENTS: Abnormal gait, cardiopulmonary status limiting activity, decreased activity tolerance, decreased balance, decreased endurance, decreased mobility, difficulty walking, decreased ROM, decreased strength, increased edema, impaired perceived functional ability, improper body mechanics, postural dysfunction, and pain.   ACTIVITY LIMITATIONS: carrying, lifting, bending, sitting, standing, squatting, stairs, transfers, bathing, and dressing  PARTICIPATION LIMITATIONS: meal prep, cleaning, laundry, driving, community activity, and yard work  PERSONAL FACTORS: N/A are also affecting patient's functional outcome.   REHAB POTENTIAL: Good  CLINICAL DECISION MAKING: Stable/uncomplicated  EVALUATION COMPLEXITY: Low   GOALS: Goals reviewed with patient? No  SHORT TERM GOALS: Target date: 12/26/23 Patient will be independent with performance of HEP to demonstrate adequate self management of symptoms.  Baseline:  Goal status: in progress  2.   Patient will report at least a 25% improvement with function and/or pain reduction overall since beginning PT. Baseline:  Goal status: in progress   LONG TERM GOALS: Target date: 01/23/23 Patient will improve LEFS score by 9 points to demonstrate improved perceived function while meeting MCID.  Baseline: Goal status: in progress 2.  Patient will improve  R knee ROM to at least 120 degrees flexion to demonstrate improved LE mobility needed for functional transfers and gait mechanics.  Baseline:  Goal status: in progress 3.  Patient will improve  R knee ROM to at least 0 degrees extension to demonstrate improved LE  mobility needed for functional transfers and gait mechanics.  Baseline:  Goal status: in progress 4.  Patient will improve 5 times sit to stand test by at least 10 seconds to demonstrate improved LE strength/power needed for prolonged standing required for iADLs.  Baseline:  Goal status: in progress    5.  Patient will increase 2 minute walk test gait distance to 40 ft with least restrictive assistive device to demonstrate improved endurance and functional mobility needed for ambulating household and community distances.  Baseline: Goal status: in progress    PLAN:  PT FREQUENCY: 2x/week  PT DURATION: 8 weeks  PLANNED INTERVENTIONS: 97164- PT Re-evaluation, 97110-Therapeutic exercises, 97530- Therapeutic activity, [id number]- Neuromuscular re-education, 97535- Self Care, [id number]- Manual therapy, [id number]- Gait training, [phone]- Electrical stimulation (manual), [id number]- Ultrasound, [id number]- Traction (mechanical), [phone] (1-2 muscles), [id number] (3+ muscles)- Dry Needling, Patient/Family education, Balance training, Stair training, Taping, Joint mobilization, Spinal mobilization, Scar mobilization, Cryotherapy, and Moist heat  PLAN FOR NEXT SESSION:  Continue with improvements on Rt knee ROM then strengthening/stabilization.  Keep check of DVT signs.    Greig KATHEE Fuse, PTA/CLT [facility] Ph: [phone]  11:33 AM, 12/20/2023

## 2023-12-22 ENCOUNTER — Ambulatory Visit (HOSPITAL_COMMUNITY)

## 2023-12-22 ENCOUNTER — Encounter (HOSPITAL_COMMUNITY): Payer: Self-pay

## 2023-12-22 DIAGNOSIS — M25561 Pain in right knee: Secondary | ICD-10-CM

## 2023-12-22 DIAGNOSIS — Z7409 Other reduced mobility: Secondary | ICD-10-CM

## 2023-12-22 DIAGNOSIS — M25661 Stiffness of right knee, not elsewhere classified: Secondary | ICD-10-CM

## 2023-12-22 DIAGNOSIS — M6281 Muscle weakness (generalized): Secondary | ICD-10-CM

## 2023-12-22 NOTE — Therapy (Signed)
 OUTPATIENT PHYSICAL THERAPY LOWER EXTREMITY TREATMENT   Patient Name: Erik Wright MRN: [id number] DOB:April 28, 1955, 68 y.o., male Today's Date: 12/22/2023  END OF SESSION:  PT End of Session - 12/22/23 1229     Visit Number 6    Number of Visits 16    Date for Recertification  12/26/23    Authorization Type [employer]    Authorization Time Period cohere approved 16 visits from 11/28/23-02/26/24    Authorization - Visit Number 6    Authorization - Number of Visits 16    Progress Note Due on Visit 10    PT Start Time 1230    PT Stop Time 1312    PT Time Calculation (min) 42 min    Activity Tolerance Patient tolerated treatment well    Behavior During Therapy WFL for tasks assessed/performed            Past Medical History:  Diagnosis Date   Anxiety    Arthritis    Chronic venous insufficiency    Headache    occ. migraine   History of gout    Hypercholesteremia    Hypertension    Past Surgical History:  Procedure Laterality Date   BACK SURGERY     lumbar disc   KNEE ARTHROSCOPY WITH MEDIAL MENISECTOMY Right 02/09/2013   Procedure: KNEE ARTHROSCOPY WITH MEDIAL MENISECTOMY with limited debridement;  Surgeon: Taft FORBES Minerva, MD;  Location: [facility];  Service: Orthopedics;  Laterality: Right;   LAPAROSCOPIC GASTRIC BANDING     TOTAL KNEE ARTHROPLASTY Right 11/22/2023   Procedure: ARTHROPLASTY, KNEE, TOTAL;  Surgeon: Beverley Evalene BIRCH, MD;  Location: [facility];  Service: Orthopedics;  Laterality: Right;   Patient Active Problem List   Diagnosis Date Noted   S/P total knee arthroplasty, right 11/22/2023   Derangement of posterior horn of medial meniscus 02/01/2013   Back pain without radiation 12/20/2012   Osteoarthritis of right knee 12/20/2012   Left ankle pain 12/20/2012   Medial meniscus, posterior horn derangement 12/20/2012   Knee pain 12/20/2012   Fractured great toe 06/26/2012   Constipation 06/26/2012    PCP: Sheryle Carwin, MD  REFERRING PROVIDER:  Beverley Evalene BIRCH, MD  REFERRING DIAG:  [phone] (ICD-10-CM) - Presence of right artificial knee joint   THERAPY DIAG:  Decreased range of motion (ROM) of right knee  Acute pain of right knee  Impaired functional mobility, balance, gait, and endurance  Muscle weakness  Rationale for Evaluation and Treatment: Rehabilitation  ONSET DATE: 11/22/23  SUBJECTIVE:   SUBJECTIVE STATEMENT: Patient reports that his knee is doing good today, but his calf is tight.   EVAL:Patient reports he did not have any home therapy. Surgery was on 11/22/23. Reports he has CPM machine but he is unable to get foot into machine. Technician came before surgery to help set up machine.   PERTINENT HISTORY: Anxiety Arthritis History of gout Back pain   PAIN:  Are you having pain? Yes: NPRS scale: 0/10 Pain location: R knee Pain description: Constant ache Aggravating factors: Any motion and activity Relieving factors: Pain Medication   PRECAUTIONS: None  RED FLAGS: None  Hasn't had a BM in 6-7 days, encouraged to contact MD  WEIGHT BEARING RESTRICTIONS: No  FALLS:  Has patient fallen in last 6 months? No  LIVING ENVIRONMENT: Stairs: No Has following equipment at home: Single point cane, Walker - 2 wheeled, Wheelchair (manual), shower chair, and bed side commode, walk in shower  OCCUPATION: Retired   PLOF: Needs assistance with  ADLs  PATIENT GOALS: To be able to walk pain free  NEXT MD VISIT: End of December, start of January  OBJECTIVE:  Note: Objective measures were completed at Evaluation unless otherwise noted.  DIAGNOSTIC FINDINGS:    PATIENT SURVEYS:  LEFS  Extreme difficulty/unable (0), Quite a bit of difficulty (1), Moderate difficulty (2), Little difficulty (3), No difficulty (4) Survey date:   evaluation  Score total:  Lower Extremity Functional Score: 4 / 80 = 5.0 %      COGNITION: Overall cognitive status: Within functional limits for tasks  assessed   SENSATION: WFL  EDEMA:  Mild edema in R knee, bruising around dressing (typical for post-op) and thigh where tourniquet was applied   12/16/23:Homan's test- no heat, redness, pain present, no increased pain with squeeze.  POSTURE: rounded shoulders, forward head, and increased thoracic kyphosis  PALPATION: TTP lateral aspect of R knee joint TTP inferior aspect just along outline of dressing   LOWER EXTREMITY ROM:  Active ROM Right eval Left eval Right 12/06/23 Right 12/10 AROM Right 12/16  Hip flexion       Hip extension       Hip abduction       Hip adduction       Hip internal rotation       Hip external rotation       Knee flexion 67  95 110 118  Knee extension -21  -8 --5 -5  Ankle dorsiflexion       Ankle plantarflexion       Ankle inversion       Ankle eversion        (Blank rows = not tested)    LOWER EXTREMITY MMT:  MMT Right eval Left eval Right 12/14/23 Left 12/14/23  Hip flexion      Hip extension      Hip abduction      Hip adduction      Hip internal rotation      Hip external rotation      Knee flexion      Knee extension   30# dynamometer 50# dynamometer  Ankle dorsiflexion 5 (dec AROM of note) 5    Ankle plantarflexion      Ankle inversion      Ankle eversion       (Blank rows = not tested)  LOWER EXTREMITY SPECIAL TESTS:    FUNCTIONAL TESTS:  5 times sit to stand: 41 seconds, R foot forward, BUE push off 2 minute walk test: 12/06/23: 2 MWT 315 ft carrying SPC  GAIT: Distance walked: 50 ft in session Assistive device utilized: Walker - 2 wheeled Level of assistance: Modified independence Comments: Pt ambulates in session with RW, dec knee flex/ext with RLE, Dec stance time on R, dec velocity  TREATMENT DATE:                                    12/22/23 EXERCISE LOG  Exercise Repetitions  and Resistance Comments  Recumbent bike   L1 x 7 minutes; seat 16  Rocking in available ROM progressing to full revolutions   Standing gastroc stretch   4 x 30 seconds    Manual therapy   STM to right gastrocnemius/soleus, hamstring, and scar mobilization  For improved soft tissue extensibility   Gait training 1 lap around gym  For improved gait speed and stride length  LAQ  20 reps  RLE only    Blank cell = exercise not performed today   12/20/23 Bike seat 16 full revolutions 5 minutes Standing:  knee flexion stretch 12 step 10X10  Hamstring curls 15X  Heelraises 15X  Gastroc stretch against wall 30 Rt only  4 forward step ups, Rt leading 10X bil UE assist Stair negotiation 4 steps ascending reciprocally 1 HR, descending reciprocally 2HR's Supine:  quad sets 10X5  SAQ 10X5  Heelslides 10X AROM -5 to 118   12/16/23: Bike seat 16 rocking.  Homan's test- no heat, redness, pain present, no increased pain with squeeze. Standing:  Knee drives for flexion on 12in step 10x 10 Prone:   Prone knee hang x 2 min  Quad stretch 3x 30 Supine:  SAQ 10x5   Heel slides 10x  AROM 5-105 degrees  Hamstring stretch with rope 3x 30  Standing:  Rockerboard Lateral movements x 1' Gait training heel to toe mechanics and equal stride length.  X 255ft  Discussed benefits of compression garments, pt declined measurements, stated his edema is minimal.  Verbalized understanding of benefits  12/14/23 Bike seat 16 full revolutions 5 minutes Standing:  SB stre tch 3X20  Knee flexion drives onto 12 10X10  Knee flexion curls 2X10  Hip abduction 10X each LE  Hip extensions 10X each LE Supine:  quad sets 10X5  AROM and PROM for knee extension (-5, -2)  Knee flexion, heelslides AROM 110 Seated MMT using dynamometer (see above graph)  PATIENT EDUCATION:  Education details: anatomy, healing, signs and symptoms of DVT, and expectations following a TKA  Person educated:  Patient Education method: Explanation Education comprehension: verbalized understanding  HOME EXERCISE PROGRAM: Access Code: [email or web address] URL: [email or web address] Date: 11/28/2023 Prepared by: Rosaria Powell-Butler  Exercises - Quad Setting and Stretching  - 2 x daily - 7 x weekly - 2 sets - 10 reps - 10 hold - Supine Heel Slide with Strap  - 2 x daily - 7 x weekly - 2 sets - 10 reps - 10 hold - Seated Long Arc Quad  - 2 x daily - 7 x weekly - 2 sets - 10 reps - Seated Knee Flexion Stretch  - 2 x daily - 7 x weekly - 2 sets - 10 reps - 10 hold - Sit to Stand  - 2 x daily - 7 x weekly - 2 sets - 10 reps  ASSESSMENT:  CLINICAL IMPRESSION: Patient presented to treatment reporting continued calf tightness and soreness. His lower extremity was assessed and he exhibited no signs or symptoms of a DVT. His familiar symptoms were able to be significantly reduced with soft tissue mobilization to his right hamstrings and gastrocnemius. He was also able to demonstrate improved gait speed and stride length following manual therapy. He was significantly educated on the  expectations following a total knee replacement and the signs and symptoms to be aware of for a DVT. He reported understanding.  He reported feeling better upon the conclusion of treatment. Patient continues to require skilled physical therapy to address her remaining impairments to return to her prior level of function.     Eval:Patient is a 68 y.o. male who was seen today for physical therapy evaluation and treatment for  Z96.651 (ICD-10-CM) - Presence of right artificial knee joint  . On this date, patient demonstrates impaired self perception of function, decreased R knee ROM/mobility, decreased LE strength, decreased endurance, and impaired balance, all of which may be contributing to patient's increased pain, decreased activity tolerance, altered gait pattern, and impairing their overall function. Educated patient and  significant other on signs on DVT/blood clots, contacting technician/support for CPM machine set up, and importance of walking program (beginning at 10 minutes a day) and HEP compliance. Patient reports understanding. Patient will benefit from skilled physical therapy to address the above/below deficits in order to improve pain and overall function.     OBJECTIVE IMPAIRMENTS: Abnormal gait, cardiopulmonary status limiting activity, decreased activity tolerance, decreased balance, decreased endurance, decreased mobility, difficulty walking, decreased ROM, decreased strength, increased edema, impaired perceived functional ability, improper body mechanics, postural dysfunction, and pain.   ACTIVITY LIMITATIONS: carrying, lifting, bending, sitting, standing, squatting, stairs, transfers, bathing, and dressing  PARTICIPATION LIMITATIONS: meal prep, cleaning, laundry, driving, community activity, and yard work  PERSONAL FACTORS: N/A are also affecting patient's functional outcome.   REHAB POTENTIAL: Good  CLINICAL DECISION MAKING: Stable/uncomplicated  EVALUATION COMPLEXITY: Low   GOALS: Goals reviewed with patient? No  SHORT TERM GOALS: Target date: 12/26/23 Patient will be independent with performance of HEP to demonstrate adequate self management of symptoms.  Baseline:  Goal status: in progress  2.   Patient will report at least a 25% improvement with function and/or pain reduction overall since beginning PT. Baseline:  Goal status: in progress   LONG TERM GOALS: Target date: 01/23/23 Patient will improve LEFS score by 9 points to demonstrate improved perceived function while meeting MCID.  Baseline: Goal status: in progress 2.  Patient will improve  R knee ROM to at least 120 degrees flexion to demonstrate improved LE mobility needed for functional transfers and gait mechanics.  Baseline:  Goal status: in progress 3.  Patient will improve  R knee ROM to at least 0 degrees  extension to demonstrate improved LE mobility needed for functional transfers and gait mechanics.  Baseline:  Goal status: in progress 4.  Patient will improve 5 times sit to stand test by at least 10 seconds to demonstrate improved LE strength/power needed for prolonged standing required for iADLs.  Baseline:  Goal status: in progress    5.  Patient will increase 2 minute walk test gait distance to 40 ft with least restrictive assistive device to demonstrate improved endurance and functional mobility needed for ambulating household and community distances.  Baseline: Goal status: in progress    PLAN:  PT FREQUENCY: 2x/week  PT DURATION: 8 weeks  PLANNED INTERVENTIONS: [id number]- PT Re-evaluation, 97110-Therapeutic exercises, 97530- Therapeutic activity, [id number]- Neuromuscular re-education, 97535- Self Care, [id number]- Manual therapy, [id number]- Gait training, [phone]- Electrical stimulation (manual), [id number]- Ultrasound, [id number]- Traction (mechanical), [phone] (1-2 muscles), [id number] (3+ muscles)- Dry Needling, Patient/Family education, Balance training, Stair training, Taping, Joint mobilization, Spinal mobilization, Scar mobilization, Cryotherapy, and Moist heat  PLAN FOR NEXT SESSION:  Continue with improvements on Rt knee ROM then strengthening/stabilization.  Keep check of DVT signs.    Lacinda Fass, PT, DPT  2:27 PM, 12/22/2023

## 2023-12-26 ENCOUNTER — Other Ambulatory Visit (HOSPITAL_COMMUNITY): Payer: Self-pay | Admitting: Internal Medicine

## 2023-12-26 ENCOUNTER — Ambulatory Visit (HOSPITAL_COMMUNITY)

## 2023-12-26 ENCOUNTER — Ambulatory Visit (HOSPITAL_COMMUNITY)
Admission: RE | Admit: 2023-12-26 | Discharge: 2023-12-26 | Disposition: A | Discharge status: Home / Self Care | Source: Ambulatory Visit | Attending: Internal Medicine | Admitting: Internal Medicine

## 2023-12-26 DIAGNOSIS — I82401 Acute embolism and thrombosis of unspecified deep veins of right lower extremity: Secondary | ICD-10-CM | POA: Insufficient documentation

## 2023-12-27 ENCOUNTER — Ambulatory Visit (HOSPITAL_COMMUNITY)

## 2023-12-27 ENCOUNTER — Telehealth (HOSPITAL_COMMUNITY): Payer: Self-pay

## 2023-12-27 NOTE — Telephone Encounter (Signed)
 No show, called and spoke to pt who thought apt was at different time. Reminded next apt date and time with contact number included if needs to reschedule/cancel in the future.   Augustin Mclean, LPTA/CLT; [facility] [phone]

## 2024-01-02 ENCOUNTER — Encounter (HOSPITAL_COMMUNITY): Payer: Self-pay

## 2024-01-02 ENCOUNTER — Ambulatory Visit (HOSPITAL_COMMUNITY)

## 2024-01-02 DIAGNOSIS — M25661 Stiffness of right knee, not elsewhere classified: Secondary | ICD-10-CM | POA: Diagnosis not present

## 2024-01-02 DIAGNOSIS — M25561 Pain in right knee: Secondary | ICD-10-CM

## 2024-01-02 DIAGNOSIS — Z7409 Other reduced mobility: Secondary | ICD-10-CM

## 2024-01-02 NOTE — Therapy (Signed)
 " OUTPATIENT PHYSICAL THERAPY LOWER EXTREMITY TREATMENT   Patient Name: Erik Wright MRN: [id number] DOB:09/16/1955, 68 y.o., male Today's Date: 01/02/2024  Progress Note Reporting Period 11/28/23 to 01/02/24  See note below for Objective Data and Assessment of Progress/Goals.     END OF SESSION:  PT End of Session - 01/02/24 1506     Visit Number 7    Number of Visits 16    Date for Recertification  01/19/24    Authorization Type [employer]    Authorization Time Period cohere approved 16 visits from 11/28/23-02/26/24    Authorization - Visit Number 7    Authorization - Number of Visits 16    Progress Note Due on Visit 16    PT Start Time 1506    PT Stop Time 1545    PT Time Calculation (min) 39 min    Activity Tolerance Patient tolerated treatment well    Behavior During Therapy WFL for tasks assessed/performed         Past Medical History:  Diagnosis Date   Anxiety    Arthritis    Chronic venous insufficiency    Headache    occ. migraine   History of gout    Hypercholesteremia    Hypertension    Past Surgical History:  Procedure Laterality Date   BACK SURGERY     lumbar disc   KNEE ARTHROSCOPY WITH MEDIAL MENISECTOMY Right 02/09/2013   Procedure: KNEE ARTHROSCOPY WITH MEDIAL MENISECTOMY with limited debridement;  Surgeon: Taft FORBES Minerva, MD;  Location: [facility];  Service: Orthopedics;  Laterality: Right;   LAPAROSCOPIC GASTRIC BANDING     TOTAL KNEE ARTHROPLASTY Right 11/22/2023   Procedure: ARTHROPLASTY, KNEE, TOTAL;  Surgeon: Beverley Evalene BIRCH, MD;  Location: [facility];  Service: Orthopedics;  Laterality: Right;   Patient Active Problem List   Diagnosis Date Noted   S/P total knee arthroplasty, right 11/22/2023   Derangement of posterior horn of medial meniscus 02/01/2013   Back pain without radiation 12/20/2012   Osteoarthritis of right knee 12/20/2012   Left ankle pain 12/20/2012   Medial meniscus, posterior horn derangement 12/20/2012   Knee  pain 12/20/2012   Fractured great toe 06/26/2012   Constipation 06/26/2012    PCP: Sheryle Carwin, MD  REFERRING PROVIDER: Beverley Evalene BIRCH, MD  REFERRING DIAG:  [phone] (ICD-10-CM) - Presence of right artificial knee joint   THERAPY DIAG:  Decreased range of motion (ROM) of right knee  Acute pain of right knee  Impaired functional mobility, balance, gait, and endurance  Rationale for Evaluation and Treatment: Rehabilitation  ONSET DATE: 11/22/23  SUBJECTIVE:   SUBJECTIVE STATEMENT: Patient reports that his R calf had been hurting him but its better today. Reports he got ultrasound of leg and they had no concerns. Reports that both knees are sore today. Keeping him up at night.     EVAL:Patient reports he did not have any home therapy. Surgery was on 11/22/23. Reports he has CPM machine but he is unable to get foot into machine. Technician came before surgery to help set up machine.   PERTINENT HISTORY: Anxiety Arthritis History of gout Back pain   PAIN:  Are you having pain? Yes: NPRS scale: 0/10 Pain location: R knee Pain description: Constant ache Aggravating factors: Any motion and activity Relieving factors: Pain Medication   PRECAUTIONS: None  RED FLAGS: None  Hasn't had a BM in 6-7 days, encouraged to contact MD  WEIGHT BEARING RESTRICTIONS: No  FALLS:  Has patient fallen  in last 6 months? No  LIVING ENVIRONMENT: Stairs: No Has following equipment at home: Single point cane, Walker - 2 wheeled, Wheelchair (manual), shower chair, and bed side commode, walk in shower  OCCUPATION: Retired   PLOF: Needs assistance with ADLs  PATIENT GOALS: To be able to walk pain free  NEXT MD VISIT: End of December, start of January  OBJECTIVE:  Note: Objective measures were completed at Evaluation unless otherwise noted.  DIAGNOSTIC FINDINGS:    PATIENT SURVEYS:  LEFS  Extreme difficulty/unable (0), Quite a bit of difficulty (1), Moderate difficulty (2),  Little difficulty (3), No difficulty (4) Survey date:   evaluation  Score total:  Lower Extremity Functional Score: 4 / 80 = 5.0 %       01/02/24: Lower Extremity Functional Score: 35 / 80 = 43.8 %     COGNITION: Overall cognitive status: Within functional limits for tasks assessed   SENSATION: WFL  EDEMA:  Mild edema in R knee, bruising around dressing (typical for post-op) and thigh where tourniquet was applied   12/16/23:Homan's test- no heat, redness, pain present, no increased pain with squeeze.   POSTURE: rounded shoulders, forward head, and increased thoracic kyphosis  PALPATION: TTP lateral aspect of R knee joint TTP inferior aspect just along outline of dressing   LOWER EXTREMITY ROM:  Active ROM Right eval Left eval Right 12/06/23 Right 12/10 AROM Right 12/16 R 01/02/24  Hip flexion        Hip extension        Hip abduction        Hip adduction        Hip internal rotation        Hip external rotation        Knee flexion 67  95 110 118 112  Knee extension -21  -8 --5 -5 -3  Ankle dorsiflexion        Ankle plantarflexion        Ankle inversion        Ankle eversion         (Blank rows = not tested)    LOWER EXTREMITY MMT:  MMT Right eval Left eval Right 12/14/23 Left 12/14/23  Hip flexion      Hip extension      Hip abduction      Hip adduction      Hip internal rotation      Hip external rotation      Knee flexion      Knee extension   30# dynamometer 50# dynamometer  Ankle dorsiflexion 5 (dec AROM of note) 5    Ankle plantarflexion      Ankle inversion      Ankle eversion       (Blank rows = not tested)  LOWER EXTREMITY SPECIAL TESTS:    FUNCTIONAL TESTS:  5 times sit to stand: 41 seconds, R foot forward, BUE push off 2 minute walk test: 12/06/23: 2 MWT 315 ft carrying [facility]  01/02/24: 5 times sit to stand: 17 seconds, no UE use, pain in both knees 6/10 2 minute walk test: 527 ft  without AD    GAIT: Distance walked:  50 ft in session Assistive device utilized: Walker - 2 wheeled Level of assistance: Modified independence Comments: Pt ambulates in session with RW, dec knee flex/ext with RLE, Dec stance time on R, dec velocity  TREATMENT DATE:  01/02/24: Bike seat 13, full revolutions 5 minutes Progress Note:  LEFS LE ROM 5 times sit to stand 2 minute walk test Review of goals  Step navigation: 7 inch steps, ascend/descend with alternating pattern, one UE support, up and down 3x, more difficult with descend Lateral step down, 6 inch step, BUE For support, 2x10  Prog to Heel taps, 2x10                                     12/22/23 EXERCISE LOG  Exercise Repetitions and Resistance Comments  Recumbent bike   L1 x 7 minutes; seat 16  Rocking in available ROM progressing to full revolutions   Standing gastroc stretch   4 x 30 seconds    Manual therapy   STM to right gastrocnemius/soleus, hamstring, and scar mobilization  For improved soft tissue extensibility   Gait training 1 lap around gym  For improved gait speed and stride length  LAQ  20 reps  RLE only    Blank cell = exercise not performed today   12/20/23 Bike seat 16 full revolutions 5 minutes Standing:  knee flexion stretch 12 step 10X10  Hamstring curls 15X  Heelraises 15X  Gastroc stretch against wall 30 Rt only  4 forward step ups, Rt leading 10X bil UE assist Stair negotiation 4 steps ascending reciprocally 1 HR, descending reciprocally 2HR's Supine:  quad sets 10X5  SAQ 10X5  Heelslides 10X AROM -5 to 118   PATIENT EDUCATION:  Education details: anatomy, healing, signs and symptoms of DVT, and expectations following a TKA  Person educated: Patient Education method: Explanation Education comprehension: verbalized understanding  HOME EXERCISE PROGRAM: Access Code: [email or web address] URL:  [email or web address] Date: 11/28/2023 Prepared by: Rosaria Powell-Butler  Exercises - Quad Setting and Stretching  - 2 x daily - 7 x weekly - 2 sets - 10 reps - 10 hold - Supine Heel Slide with Strap  - 2 x daily - 7 x weekly - 2 sets - 10 reps - 10 hold - Seated Long Arc Quad  - 2 x daily - 7 x weekly - 2 sets - 10 reps - Seated Knee Flexion Stretch  - 2 x daily - 7 x weekly - 2 sets - 10 reps - 10 hold - Sit to Stand  - 2 x daily - 7 x weekly - 2 sets - 10 reps    ASSESSMENT:  CLINICAL IMPRESSION: Progress note performed this date. Patient demonstrates good progress with self perceived function, LE ROM, LE strength and and endurance via five times sit to stand and two minute walk test, as well as gait mechanics. Patient has met all short term goals and making progress towards meeting all long term goals. Patient reports feeling 80% better since beginning PT. Pain is most limiting factor.  Patient continues to require skilled physical therapy to address her remaining impairments to return to her prior level of function.       Eval:Patient is a 68 y.o. male who was seen today for physical therapy evaluation and treatment for  Z96.651 (ICD-10-CM) - Presence of right artificial knee joint  . On this date, patient demonstrates impaired self perception of function, decreased R knee ROM/mobility, decreased LE strength, decreased endurance, and impaired balance, all of which may be contributing to patient's increased pain, decreased activity tolerance, altered gait pattern, and impairing their overall function. Educated patient and significant other  on signs on DVT/blood clots, contacting technician/support for CPM machine set up, and importance of walking program (beginning at 10 minutes a day) and HEP compliance. Patient reports understanding. Patient will benefit from skilled physical therapy to address the above/below deficits in order to improve pain and overall function.      OBJECTIVE IMPAIRMENTS: Abnormal gait, cardiopulmonary status limiting activity, decreased activity tolerance, decreased balance, decreased endurance, decreased mobility, difficulty walking, decreased ROM, decreased strength, increased edema, impaired perceived functional ability, improper body mechanics, postural dysfunction, and pain.   ACTIVITY LIMITATIONS: carrying, lifting, bending, sitting, standing, squatting, stairs, transfers, bathing, and dressing  PARTICIPATION LIMITATIONS: meal prep, cleaning, laundry, driving, community activity, and yard work  PERSONAL FACTORS: N/A are also affecting patient's functional outcome.   REHAB POTENTIAL: Good  CLINICAL DECISION MAKING: Stable/uncomplicated  EVALUATION COMPLEXITY: Low   GOALS: Goals reviewed with patient? No  SHORT TERM GOALS: Target date: 12/26/23 Patient will be independent with performance of HEP to demonstrate adequate self management of symptoms.  Baseline: Every day compliance  Goal status: MET  2.   Patient will report at least a 25% improvement with function and/or pain reduction overall since beginning PT. Baseline: Reports 80% improvement since beginning PT Goal status: MET   LONG TERM GOALS: Target date: 01/23/23 Patient will improve LEFS score by 50 points from original score to demonstrate improved perceived function while meeting MCID.  Baseline: 35/80 on 01/02/24 Goal status: Revised on 12/29 2.  Patient will improve  R knee ROM to at least 120 degrees flexion to demonstrate improved LE mobility needed for functional transfers and gait mechanics.  Baseline: see above Goal status: IN PROGRESS  3.  Patient will improve  R knee ROM to at least 0 degrees extension to demonstrate improved LE mobility needed for functional transfers and gait mechanics.  Baseline: see above Goal status: in progress 4.  Patient will improve 5 times sit to stand test by at least 10 seconds to demonstrate improved LE  strength/power needed for prolonged standing required for iADLs.  Baseline:  Goal status: MET   5.  Patient will increase 2 minute walk test gait distance to 40 ft with least restrictive assistive device to demonstrate improved endurance and functional mobility needed for ambulating household and community distances.  Baseline: Goal status: MET    PLAN:  PT FREQUENCY: 2x/week  PT DURATION: 8 weeks  PLANNED INTERVENTIONS: 97164- PT Re-evaluation, 97110-Therapeutic exercises, 97530- Therapeutic activity, [id number]- Neuromuscular re-education, 97535- Self Care, [id number]- Manual therapy, [id number]- Gait training, [phone]- Electrical stimulation (manual), [id number]- Ultrasound, [id number]- Traction (mechanical), [phone] (1-2 muscles), [id number] (3+ muscles)- Dry Needling, Patient/Family education, Balance training, Stair training, Taping, Joint mobilization, Spinal mobilization, Scar mobilization, Cryotherapy, and Moist heat  PLAN FOR NEXT SESSION:  Continue with improvements on Rt knee ROM then strengthening/stabilization.     4:48 PM, 01/02/2024 Rosaria Settler, PT, DPT [facility] [place] "

## 2024-01-06 ENCOUNTER — Ambulatory Visit (HOSPITAL_COMMUNITY): Attending: Orthopedic Surgery

## 2024-01-06 DIAGNOSIS — M25561 Pain in right knee: Secondary | ICD-10-CM | POA: Insufficient documentation

## 2024-01-06 DIAGNOSIS — M6281 Muscle weakness (generalized): Secondary | ICD-10-CM | POA: Insufficient documentation

## 2024-01-06 DIAGNOSIS — M25661 Stiffness of right knee, not elsewhere classified: Secondary | ICD-10-CM | POA: Insufficient documentation

## 2024-01-06 DIAGNOSIS — Z7409 Other reduced mobility: Secondary | ICD-10-CM | POA: Insufficient documentation

## 2024-01-10 ENCOUNTER — Ambulatory Visit (HOSPITAL_COMMUNITY)

## 2024-01-10 DIAGNOSIS — M6281 Muscle weakness (generalized): Secondary | ICD-10-CM | POA: Diagnosis present

## 2024-01-10 DIAGNOSIS — Z7409 Other reduced mobility: Secondary | ICD-10-CM | POA: Diagnosis present

## 2024-01-10 DIAGNOSIS — M25661 Stiffness of right knee, not elsewhere classified: Secondary | ICD-10-CM

## 2024-01-10 DIAGNOSIS — M25561 Pain in right knee: Secondary | ICD-10-CM | POA: Diagnosis present

## 2024-01-10 NOTE — Therapy (Signed)
 " OUTPATIENT PHYSICAL THERAPY LOWER EXTREMITY TREATMENT   Patient Name: Erik Wright MRN: [id number] DOB:[birth year], 69 y.o., male Today's Date: 01/10/2024     END OF SESSION:  PT End of Session - 01/10/24 1451     Visit Number 8    Number of Visits 16    Date for Recertification  01/19/24    Authorization Type [employer]    Authorization Time Period cohere approved 16 visits from 11/28/23-02/26/24    Authorization - Visit Number 8    Authorization - Number of Visits 16    Progress Note Due on Visit 16    PT Start Time 1451    PT Stop Time 1531    PT Time Calculation (min) 40 min    Activity Tolerance Patient tolerated treatment well    Behavior During Therapy WFL for tasks assessed/performed         Past Medical History:  Diagnosis Date   Anxiety    Arthritis    Chronic venous insufficiency    Headache    occ. migraine   History of gout    Hypercholesteremia    Hypertension    Past Surgical History:  Procedure Laterality Date   BACK SURGERY     lumbar disc   KNEE ARTHROSCOPY WITH MEDIAL MENISECTOMY Right 02/09/2013   Procedure: KNEE ARTHROSCOPY WITH MEDIAL MENISECTOMY with limited debridement;  Surgeon: Taft FORBES Minerva, MD;  Location: [facility];  Service: Orthopedics;  Laterality: Right;   LAPAROSCOPIC GASTRIC BANDING     TOTAL KNEE ARTHROPLASTY Right 11/22/2023   Procedure: ARTHROPLASTY, KNEE, TOTAL;  Surgeon: Beverley Evalene BIRCH, MD;  Location: [facility];  Service: Orthopedics;  Laterality: Right;   Patient Active Problem List   Diagnosis Date Noted   S/P total knee arthroplasty, right 11/22/2023   Derangement of posterior horn of medial meniscus 02/01/2013   Back pain without radiation 12/20/2012   Osteoarthritis of right knee 12/20/2012   Left ankle pain 12/20/2012   Medial meniscus, posterior horn derangement 12/20/2012   Knee pain 12/20/2012   Fractured great toe 06/26/2012   Constipation 06/26/2012    PCP: Sheryle Carwin, MD  REFERRING PROVIDER:  Beverley Evalene BIRCH, MD  REFERRING DIAG:  [phone] (ICD-10-CM) - Presence of right artificial knee joint   THERAPY DIAG:  Decreased range of motion (ROM) of right knee  Acute pain of right knee  Impaired functional mobility, balance, gait, and endurance  Muscle weakness  Rationale for Evaluation and Treatment: Rehabilitation  ONSET DATE: 11/22/23  SUBJECTIVE:   SUBJECTIVE STATEMENT: 2/10 pain on arrival today; says that mostly has pain at night but trying to stay away from the narcotics due to constipation; returns to MD in a month    EVAL:Patient reports he did not have any home therapy. Surgery was on 11/22/23. Reports he has CPM machine but he is unable to get foot into machine. Technician came before surgery to help set up machine.   PERTINENT HISTORY: Anxiety Arthritis History of gout Back pain   PAIN:  Are you having pain? Yes: NPRS scale: 0/10 Pain location: R knee Pain description: Constant ache Aggravating factors: Any motion and activity Relieving factors: Pain Medication   PRECAUTIONS: None  RED FLAGS: None  Hasn't had a BM in 6-7 days, encouraged to contact MD  WEIGHT BEARING RESTRICTIONS: No  FALLS:  Has patient fallen in last 6 months? No  LIVING ENVIRONMENT: Stairs: No Has following equipment at home: Single point cane, Walker - 2 wheeled, Wheelchair (manual), shower  chair, and bed side commode, walk in shower  OCCUPATION: Retired   PLOF: Needs assistance with ADLs  PATIENT GOALS: To be able to walk pain free  NEXT MD VISIT: End of December, start of January  OBJECTIVE:  Note: Objective measures were completed at Evaluation unless otherwise noted.  DIAGNOSTIC FINDINGS:    PATIENT SURVEYS:  LEFS  Extreme difficulty/unable (0), Quite a bit of difficulty (1), Moderate difficulty (2), Little difficulty (3), No difficulty (4) Survey date:   evaluation  Score total:  Lower Extremity Functional Score: 4 / 80 = 5.0 %        01/02/24: Lower Extremity Functional Score: 35 / 80 = 43.8 %     COGNITION: Overall cognitive status: Within functional limits for tasks assessed   SENSATION: WFL  EDEMA:  Mild edema in R knee, bruising around dressing (typical for post-op) and thigh where tourniquet was applied   12/16/23:Homan's test- no heat, redness, pain present, no increased pain with squeeze.   POSTURE: rounded shoulders, forward head, and increased thoracic kyphosis  PALPATION: TTP lateral aspect of R knee joint TTP inferior aspect just along outline of dressing   LOWER EXTREMITY ROM:  Active ROM Right eval Left eval Right 12/06/23 Right 12/10 AROM Right 12/16 R 01/02/24 Right 01/10/2024  Hip flexion         Hip extension         Hip abduction         Hip adduction         Hip internal rotation         Hip external rotation         Knee flexion 67  95 110 118 112 117  Knee extension -21  -8 --5 -5 -3 -3  Ankle dorsiflexion         Ankle plantarflexion         Ankle inversion         Ankle eversion          (Blank rows = not tested)    LOWER EXTREMITY MMT:  MMT Right eval Left eval Right 12/14/23 Left 12/14/23  Hip flexion      Hip extension      Hip abduction      Hip adduction      Hip internal rotation      Hip external rotation      Knee flexion      Knee extension   30# dynamometer 50# dynamometer  Ankle dorsiflexion 5 (dec AROM of note) 5    Ankle plantarflexion      Ankle inversion      Ankle eversion       (Blank rows = not tested)  LOWER EXTREMITY SPECIAL TESTS:    FUNCTIONAL TESTS:  5 times sit to stand: 41 seconds, R foot forward, BUE push off 2 minute walk test: 12/06/23: 2 MWT 315 ft carrying [facility]  01/02/24: 5 times sit to stand: 17 seconds, no UE use, pain in both knees 6/10 2 minute walk test: 527 ft  without AD    GAIT: Distance walked: 50 ft in session Assistive device utilized: Walker - 2 wheeled Level of assistance: Modified  independence Comments: Pt ambulates in session with RW, dec knee flex/ext with RLE, Dec stance time on R, dec velocity  TREATMENT DATE:  01/10/23 Bike seat 15 x 5 minutes dynamic warm up full forward revolutions  Heel raises 2 x 10 12 step knee drives for flexion x 2' Slant board 5 x 20 Standing hamstring stretch 5 x 20 6 step ups 2 x 10 6 lateral step ups 2 x 10 Supine manual knee extension x 5 AROM right knee -3 to 117 Supine SLR 2 x 5       01/02/24: Bike seat 13, full revolutions 5 minutes Progress Note:  LEFS LE ROM 5 times sit to stand 2 minute walk test Review of goals  Step navigation: 7 inch steps, ascend/descend with alternating pattern, one UE support, up and down 3x, more difficult with descend Lateral step down, 6 inch step, BUE For support, 2x10  Prog to Heel taps, 2x10                                     12/22/23 EXERCISE LOG  Exercise Repetitions and Resistance Comments  Recumbent bike   L1 x 7 minutes; seat 16  Rocking in available ROM progressing to full revolutions   Standing gastroc stretch   4 x 30 seconds    Manual therapy   STM to right gastrocnemius/soleus, hamstring, and scar mobilization  For improved soft tissue extensibility   Gait training 1 lap around gym  For improved gait speed and stride length  LAQ  20 reps  RLE only    Blank cell = exercise not performed today   12/20/23 Bike seat 16 full revolutions 5 minutes Standing:  knee flexion stretch 12 step 10X10  Hamstring curls 15X  Heelraises 15X  Gastroc stretch against wall 30 Rt only  4 forward step ups, Rt leading 10X bil UE assist Stair negotiation 4 steps ascending reciprocally 1 HR, descending reciprocally 2HR's Supine:  quad sets 10X5  SAQ 10X5  Heelslides 10X AROM -5 to 118   PATIENT EDUCATION:  Education details: anatomy, healing,  signs and symptoms of DVT, and expectations following a TKA  Person educated: Patient Education method: Explanation Education comprehension: verbalized understanding  HOME EXERCISE PROGRAM: Access Code: [email or web address] URL: [email or web address] Date: 11/28/2023 Prepared by: Rosaria Powell-Butler  Exercises - Quad Setting and Stretching  - 2 x daily - 7 x weekly - 2 sets - 10 reps - 10 hold - Supine Heel Slide with Strap  - 2 x daily - 7 x weekly - 2 sets - 10 reps - 10 hold - Seated Long Arc Quad  - 2 x daily - 7 x weekly - 2 sets - 10 reps - Seated Knee Flexion Stretch  - 2 x daily - 7 x weekly - 2 sets - 10 reps - 10 hold - Sit to Stand  - 2 x daily - 7 x weekly - 2 sets - 10 reps    ASSESSMENT:  CLINICAL IMPRESSION: Patient arrives without AD today.  He continues with slight antalgic gait; decreased confidence with steps out in the community; does not have any at home.  Decreased strength right lower extremity strength especially with descending step.   Continues with mild right knee extension lag so performed manual knee extension and then SLR's with focus on good control and form.  Good progress with knee flexion overall.   Patient continues to require skilled physical therapy to address his remaining impairments to return to his prior level of function.  Eval:Patient is a 69 y.o. male who was seen today for physical therapy evaluation and treatment for  Z96.651 (ICD-10-CM) - Presence of right artificial knee joint  . On this date, patient demonstrates impaired self perception of function, decreased R knee ROM/mobility, decreased LE strength, decreased endurance, and impaired balance, all of which may be contributing to patient's increased pain, decreased activity tolerance, altered gait pattern, and impairing their overall function. Educated patient and significant other on signs on DVT/blood clots, contacting technician/support for CPM machine set up, and importance of  walking program (beginning at 10 minutes a day) and HEP compliance. Patient reports understanding. Patient will benefit from skilled physical therapy to address the above/below deficits in order to improve pain and overall function.     OBJECTIVE IMPAIRMENTS: Abnormal gait, cardiopulmonary status limiting activity, decreased activity tolerance, decreased balance, decreased endurance, decreased mobility, difficulty walking, decreased ROM, decreased strength, increased edema, impaired perceived functional ability, improper body mechanics, postural dysfunction, and pain.   ACTIVITY LIMITATIONS: carrying, lifting, bending, sitting, standing, squatting, stairs, transfers, bathing, and dressing  PARTICIPATION LIMITATIONS: meal prep, cleaning, laundry, driving, community activity, and yard work  PERSONAL FACTORS: N/A are also affecting patient's functional outcome.   REHAB POTENTIAL: Good  CLINICAL DECISION MAKING: Stable/uncomplicated  EVALUATION COMPLEXITY: Low   GOALS: Goals reviewed with patient? No  SHORT TERM GOALS: Target date: 12/26/23 Patient will be independent with performance of HEP to demonstrate adequate self management of symptoms.  Baseline: Every day compliance  Goal status: MET  2.   Patient will report at least a 25% improvement with function and/or pain reduction overall since beginning PT. Baseline: Reports 80% improvement since beginning PT Goal status: MET   LONG TERM GOALS: Target date: 01/23/23 Patient will improve LEFS score by 50 points from original score to demonstrate improved perceived function while meeting MCID.  Baseline: 35/80 on 01/02/24 Goal status: Revised on 12/29 2.  Patient will improve  R knee ROM to at least 120 degrees flexion to demonstrate improved LE mobility needed for functional transfers and gait mechanics.  Baseline: see above Goal status: IN PROGRESS  3.  Patient will improve  R knee ROM to at least 0 degrees extension to demonstrate  improved LE mobility needed for functional transfers and gait mechanics.  Baseline: see above Goal status: in progress 4.  Patient will improve 5 times sit to stand test by at least 10 seconds to demonstrate improved LE strength/power needed for prolonged standing required for iADLs.  Baseline:  Goal status: MET   5.  Patient will increase 2 minute walk test gait distance to 40 ft with least restrictive assistive device to demonstrate improved endurance and functional mobility needed for ambulating household and community distances.  Baseline: Goal status: MET    PLAN:  PT FREQUENCY: 2x/week  PT DURATION: 8 weeks  PLANNED INTERVENTIONS: [id number]- PT Re-evaluation, 97110-Therapeutic exercises, 97530- Therapeutic activity, [id number]- Neuromuscular re-education, 97535- Self Care, [id number]- Manual therapy, [id number]- Gait training, [phone]- Electrical stimulation (manual), [id number]- Ultrasound, [id number]- Traction (mechanical), [phone] (1-2 muscles), [id number] (3+ muscles)- Dry Needling, Patient/Family education, Balance training, Stair training, Taping, Joint mobilization, Spinal mobilization, Scar mobilization, Cryotherapy, and Moist heat  PLAN FOR NEXT SESSION:  Continue with improvements on Rt knee ROM then strengthening/stabilization.    2:52 PM, 01/10/2024 Cristie Mckinney Small Dahiana Kulak MPT [facility] [place] [phone] [phone]  "

## 2024-01-13 ENCOUNTER — Ambulatory Visit (HOSPITAL_COMMUNITY)

## 2024-01-16 ENCOUNTER — Telehealth (HOSPITAL_COMMUNITY): Payer: Self-pay

## 2024-01-16 ENCOUNTER — Ambulatory Visit (HOSPITAL_COMMUNITY)

## 2024-01-16 NOTE — Telephone Encounter (Signed)
 Called patient concerning missed appointment this date. Patient reports he thought appointment was tomorrow at 3 pm. Reminded of next scheduled appointment on Thursday, 01/19/24, at 345 pm.    5:44 PM, 01/16/2024 Rosaria Settler, PT, DPT [facility] [place]

## 2024-01-19 ENCOUNTER — Encounter (HOSPITAL_COMMUNITY): Payer: Self-pay

## 2024-01-19 ENCOUNTER — Ambulatory Visit (HOSPITAL_COMMUNITY)

## 2024-01-19 DIAGNOSIS — M25561 Pain in right knee: Secondary | ICD-10-CM

## 2024-01-19 DIAGNOSIS — Z7409 Other reduced mobility: Secondary | ICD-10-CM

## 2024-01-19 DIAGNOSIS — M25661 Stiffness of right knee, not elsewhere classified: Secondary | ICD-10-CM

## 2024-01-19 DIAGNOSIS — M6281 Muscle weakness (generalized): Secondary | ICD-10-CM

## 2024-01-19 NOTE — Therapy (Signed)
 " OUTPATIENT PHYSICAL THERAPY LOWER EXTREMITY TREATMENT   Patient Name: WARREN LINDAHL MRN: [id number] DOB:June 10, 1955, 69 y.o., male Today's Date: 01/19/2024     END OF SESSION:  PT End of Session - 01/19/24 1542     Visit Number 9    Number of Visits 16    Date for Recertification  01/19/24    Authorization Type [employer]    Authorization Time Period cohere approved 16 visits from 11/28/23-02/26/24    Authorization - Visit Number 9    Authorization - Number of Visits 16    Progress Note Due on Visit 16    PT Start Time 1545    PT Stop Time 1628    PT Time Calculation (min) 43 min    Activity Tolerance Patient tolerated treatment well    Behavior During Therapy WFL for tasks assessed/performed          Past Medical History:  Diagnosis Date   Anxiety    Arthritis    Chronic venous insufficiency    Headache    occ. migraine   History of gout    Hypercholesteremia    Hypertension    Past Surgical History:  Procedure Laterality Date   BACK SURGERY     lumbar disc   KNEE ARTHROSCOPY WITH MEDIAL MENISECTOMY Right 02/09/2013   Procedure: KNEE ARTHROSCOPY WITH MEDIAL MENISECTOMY with limited debridement;  Surgeon: Taft FORBES Minerva, MD;  Location: [facility];  Service: Orthopedics;  Laterality: Right;   LAPAROSCOPIC GASTRIC BANDING     TOTAL KNEE ARTHROPLASTY Right 11/22/2023   Procedure: ARTHROPLASTY, KNEE, TOTAL;  Surgeon: Beverley Evalene BIRCH, MD;  Location: [facility];  Service: Orthopedics;  Laterality: Right;   Patient Active Problem List   Diagnosis Date Noted   S/P total knee arthroplasty, right 11/22/2023   Derangement of posterior horn of medial meniscus 02/01/2013   Back pain without radiation 12/20/2012   Osteoarthritis of right knee 12/20/2012   Left ankle pain 12/20/2012   Medial meniscus, posterior horn derangement 12/20/2012   Knee pain 12/20/2012   Fractured great toe 06/26/2012   Constipation 06/26/2012    PCP: Sheryle Carwin, MD  REFERRING PROVIDER:  Beverley Evalene BIRCH, MD  REFERRING DIAG:  [phone] (ICD-10-CM) - Presence of right artificial knee joint   THERAPY DIAG:  Decreased range of motion (ROM) of right knee  Acute pain of right knee  Impaired functional mobility, balance, gait, and endurance  Muscle weakness  Rationale for Evaluation and Treatment: Rehabilitation  ONSET DATE: 11/22/23  SUBJECTIVE:   SUBJECTIVE STATEMENT: Patient reports that he is doing so much better now. He can sleep and move better now. He feels comfortable with being discharged at this time.  EVAL:Patient reports he did not have any home therapy. Surgery was on 11/22/23. Reports he has CPM machine but he is unable to get foot into machine. Technician came before surgery to help set up machine.   PERTINENT HISTORY: Anxiety Arthritis History of gout Back pain   PAIN:  Are you having pain? Yes: NPRS scale: 0/10 Pain location: R knee Pain description: Constant ache Aggravating factors: Any motion and activity Relieving factors: Pain Medication   PRECAUTIONS: None  RED FLAGS: None  Hasn't had a BM in 6-7 days, encouraged to contact MD  WEIGHT BEARING RESTRICTIONS: No  FALLS:  Has patient fallen in last 6 months? No  LIVING ENVIRONMENT: Stairs: No Has following equipment at home: Single point cane, Walker - 2 wheeled, Wheelchair (manual), shower chair, and bed side  commode, walk in shower  OCCUPATION: Retired   PLOF: Needs assistance with ADLs  PATIENT GOALS: To be able to walk pain free  NEXT MD VISIT: End of December, start of January  OBJECTIVE:  Note: Objective measures were completed at Evaluation unless otherwise noted.  DIAGNOSTIC FINDINGS:    PATIENT SURVEYS:  LEFS  Extreme difficulty/unable (0), Quite a bit of difficulty (1), Moderate difficulty (2), Little difficulty (3), No difficulty (4) Survey date:   evaluation  Score total:  Lower Extremity Functional Score: 4 / 80 = 5.0 %       01/02/24: Lower  Extremity Functional Score: 35 / 80 = 43.8 %     COGNITION: Overall cognitive status: Within functional limits for tasks assessed   SENSATION: WFL  EDEMA:  Mild edema in R knee, bruising around dressing (typical for post-op) and thigh where tourniquet was applied   12/16/23:Homan's test- no heat, redness, pain present, no increased pain with squeeze.   POSTURE: rounded shoulders, forward head, and increased thoracic kyphosis  PALPATION: TTP lateral aspect of R knee joint TTP inferior aspect just along outline of dressing   LOWER EXTREMITY ROM:  Active ROM Right eval Left eval Right 12/06/23 Right 12/10 AROM Right 12/16 R 01/02/24 Right 01/10/2024  Hip flexion         Hip extension         Hip abduction         Hip adduction         Hip internal rotation         Hip external rotation         Knee flexion 67  95 110 118 112 117  Knee extension -21  -8 --5 -5 -3 -3  Ankle dorsiflexion         Ankle plantarflexion         Ankle inversion         Ankle eversion          (Blank rows = not tested)    LOWER EXTREMITY MMT:  MMT Right eval Left eval Right 12/14/23 Left 12/14/23  Hip flexion      Hip extension      Hip abduction      Hip adduction      Hip internal rotation      Hip external rotation      Knee flexion      Knee extension   30# dynamometer 50# dynamometer  Ankle dorsiflexion 5 (dec AROM of note) 5    Ankle plantarflexion      Ankle inversion      Ankle eversion       (Blank rows = not tested)  LOWER EXTREMITY SPECIAL TESTS:    FUNCTIONAL TESTS:  5 times sit to stand: 41 seconds, R foot forward, BUE push off 2 minute walk test: 12/06/23: 2 MWT 315 ft carrying [facility]  01/02/24: 5 times sit to stand: 17 seconds, no UE use, pain in both knees 6/10 2 minute walk test: 527 ft  without AD    GAIT: Distance walked: 50 ft in session Assistive device utilized: Walker - 2 wheeled Level of assistance: Modified independence Comments: Pt  ambulates in session with RW, dec knee flex/ext with RLE, Dec stance time on R, dec velocity  TREATMENT DATE:                                    01/19/24 EXERCISE LOG  Exercise Repetitions and Resistance Comments  Recumbent bike  L1 x 7 minutes @ seat 16   Standing gastroc stretch  3 x 30 seconds    Stepping over hurdles   4 x 20 feet    Eccentric heel tap  6 step x15 reps    Car simulation   Simulating getting into and out of his car  Seated hip ADD isometric  20 reps w/ 5 second hold    Seated hip flexion  GTB x 20 reps each    Rocker board  3 minutes    Goal assessment  See below  For discharge   Blank cell = exercise not performed today   01/10/23 Bike seat 15 x 5 minutes dynamic warm up full forward revolutions  Heel raises 2 x 10 12 step knee drives for flexion x 2' Slant board 5 x 20 Standing hamstring stretch 5 x 20 6 step ups 2 x 10 6 lateral step ups 2 x 10 Supine manual knee extension x 5 AROM right knee -3 to 117 Supine SLR 2 x 5   01/02/24: Bike seat 13, full revolutions 5 minutes Progress Note:  LEFS LE ROM 5 times sit to stand 2 minute walk test Review of goals  Step navigation: 7 inch steps, ascend/descend with alternating pattern, one UE support, up and down 3x, more difficult with descend Lateral step down, 6 inch step, BUE For support, 2x10  Prog to Heel taps, 2x10   PATIENT EDUCATION:  Education details: anatomy, healing, signs and symptoms of DVT, and expectations following a TKA  Person educated: Patient Education method: Explanation Education comprehension: verbalized understanding  HOME EXERCISE PROGRAM: Access Code: [email or web address] URL: [email or web address] Date: 11/28/2023 Prepared by: Rosaria Powell-Butler  Exercises - Quad Setting and Stretching  - 2 x daily - 7 x weekly - 2 sets - 10 reps - 10  hold - Supine Heel Slide with Strap  - 2 x daily - 7 x weekly - 2 sets - 10 reps - 10 hold - Seated Long Arc Quad  - 2 x daily - 7 x weekly - 2 sets - 10 reps - Seated Knee Flexion Stretch  - 2 x daily - 7 x weekly - 2 sets - 10 reps - 10 hold - Sit to Stand  - 2 x daily - 7 x weekly - 2 sets - 10 reps    ASSESSMENT:  CLINICAL IMPRESSION: Patient has made good progress with skilled physical therapy as evidenced by his subjective reports, objective measures, functional mobility, and progress toward his goals. He was able to meet all of his goals for skilled physical therapy excluding knee flexion AROM. His most recent measurement was 117 degrees, but he declined an updated measurement today. His HEP was updated with today's new interventions. He reported feeling comfortable being discharged at this time with his HEP.   PHYSICAL THERAPY DISCHARGE SUMMARY  Visits from Start of Care: 9  Current functional level related to goals / functional outcomes: Patient was able to meet or nearly meet all of his goals for physical therapy.    Remaining deficits: AROM    Education / Equipment: HEP    Patient agrees to discharge. Patient goals were nearly met. Patient is being discharged due to  being pleased with the current functional level.    Eval:Patient is a 69 y.o. male who was seen today for physical therapy evaluation and treatment for  Z96.651 (ICD-10-CM) - Presence of right artificial knee joint  . On this date, patient demonstrates impaired self perception of function, decreased R knee ROM/mobility, decreased LE strength, decreased endurance, and impaired balance, all of which may be contributing to patient's increased pain, decreased activity tolerance, altered gait pattern, and impairing their overall function. Educated patient and significant other on signs on DVT/blood clots, contacting technician/support for CPM machine set up, and importance of walking program (beginning at 10 minutes a  day) and HEP compliance. Patient reports understanding. Patient will benefit from skilled physical therapy to address the above/below deficits in order to improve pain and overall function.     OBJECTIVE IMPAIRMENTS: Abnormal gait, cardiopulmonary status limiting activity, decreased activity tolerance, decreased balance, decreased endurance, decreased mobility, difficulty walking, decreased ROM, decreased strength, increased edema, impaired perceived functional ability, improper body mechanics, postural dysfunction, and pain.   ACTIVITY LIMITATIONS: carrying, lifting, bending, sitting, standing, squatting, stairs, transfers, bathing, and dressing  PARTICIPATION LIMITATIONS: meal prep, cleaning, laundry, driving, community activity, and yard work  PERSONAL FACTORS: N/A are also affecting patient's functional outcome.   REHAB POTENTIAL: Good  CLINICAL DECISION MAKING: Stable/uncomplicated  EVALUATION COMPLEXITY: Low   GOALS: Goals reviewed with patient? No  SHORT TERM GOALS: Target date: 12/26/23 Patient will be independent with performance of HEP to demonstrate adequate self management of symptoms.  Baseline: Every day compliance  Goal status: MET  2.   Patient will report at least a 25% improvement with function and/or pain reduction overall since beginning PT. Baseline: Reports 80% improvement since beginning PT Goal status: MET   LONG TERM GOALS: Target date: 01/23/23 Patient will improve LEFS score by 50 points from original score to demonstrate improved perceived function while meeting MCID.  Baseline: 35/80 on 01/02/24 01/19/24: 49/80 Goal status: Revised on 12/29 2.  Patient will improve  R knee ROM to at least 120 degrees flexion to demonstrate improved LE mobility needed for functional transfers and gait mechanics.  Baseline: 01/19/24: Patient refused measurement  Goal status: IN PROGRESS  3.  Patient will improve  R knee ROM to at least 0 degrees extension to demonstrate  improved LE mobility needed for functional transfers and gait mechanics.  Baseline: 0 degrees on 01/19/24 Goal status: MET 4.  Patient will improve 5 times sit to stand test by at least 10 seconds to demonstrate improved LE strength/power needed for prolonged standing required for iADLs.  Baseline:  Goal status: MET   5.  Patient will increase 2 minute walk test gait distance to 40 ft with least restrictive assistive device to demonstrate improved endurance and functional mobility needed for ambulating household and community distances.  Baseline: Goal status: MET    PLAN:  PT FREQUENCY: 2x/week  PT DURATION: 8 weeks  PLANNED INTERVENTIONS: 97164- PT Re-evaluation, 97110-Therapeutic exercises, 97530- Therapeutic activity, [id number]- Neuromuscular re-education, 97535- Self Care, [id number]- Manual therapy, [id number]- Gait training, [phone]- Electrical stimulation (manual), [id number]- Ultrasound, [id number]- Traction (mechanical), [phone] (1-2 muscles), [id number] (3+ muscles)- Dry Needling, Patient/Family education, Balance training, Stair training, Taping, Joint mobilization, Spinal mobilization, Scar mobilization, Cryotherapy, and Moist heat  PLAN FOR NEXT SESSION:  Continue with improvements on Rt knee ROM then strengthening/stabilization.    Lacinda Fass, PT, DPT  4:58 PM, 01/19/24   "
